# Patient Record
Sex: Male | Born: 1945 | Race: White | Hispanic: No | Marital: Married | Smoking: Never smoker
Health system: Southern US, Academic
[De-identification: ages and names within clinical notes are randomized; demographics above are authoritative.]

## PROBLEM LIST (undated history)

## (undated) DIAGNOSIS — F419 Anxiety disorder, unspecified: Secondary | ICD-10-CM

## (undated) DIAGNOSIS — I6522 Occlusion and stenosis of left carotid artery: Secondary | ICD-10-CM

## (undated) DIAGNOSIS — I739 Peripheral vascular disease, unspecified: Secondary | ICD-10-CM

## (undated) DIAGNOSIS — H9190 Unspecified hearing loss, unspecified ear: Secondary | ICD-10-CM

## (undated) DIAGNOSIS — I10 Essential (primary) hypertension: Secondary | ICD-10-CM

## (undated) DIAGNOSIS — R972 Elevated prostate specific antigen [PSA]: Secondary | ICD-10-CM

## (undated) DIAGNOSIS — H8109 Meniere's disease, unspecified ear: Secondary | ICD-10-CM

## (undated) DIAGNOSIS — F0390 Unspecified dementia without behavioral disturbance: Secondary | ICD-10-CM

## (undated) DIAGNOSIS — K219 Gastro-esophageal reflux disease without esophagitis: Secondary | ICD-10-CM

## (undated) DIAGNOSIS — I2510 Atherosclerotic heart disease of native coronary artery without angina pectoris: Secondary | ICD-10-CM

## (undated) DIAGNOSIS — G40909 Epilepsy, unspecified, not intractable, without status epilepticus: Secondary | ICD-10-CM

## (undated) DIAGNOSIS — E785 Hyperlipidemia, unspecified: Secondary | ICD-10-CM

## (undated) DIAGNOSIS — J449 Chronic obstructive pulmonary disease, unspecified: Secondary | ICD-10-CM

## (undated) HISTORY — DX: Essential (primary) hypertension: I10

## (undated) HISTORY — DX: Gastro-esophageal reflux disease without esophagitis: K21.9

## (undated) HISTORY — DX: Elevated prostate specific antigen (PSA): R97.20

## (undated) HISTORY — PX: HX CORONARY ARTERY BYPASS GRAFT: SHX141

## (undated) HISTORY — DX: Meniere's disease, unspecified ear: H81.09

## (undated) HISTORY — DX: Hyperlipidemia, unspecified: E78.5

## (undated) HISTORY — DX: Occlusion and stenosis of left carotid artery: I65.22

## (undated) HISTORY — DX: Unspecified hearing loss, unspecified ear: H91.90

## (undated) HISTORY — DX: Epilepsy, unspecified, not intractable, without status epilepticus: G40.909

## (undated) HISTORY — DX: Atherosclerotic heart disease of native coronary artery without angina pectoris: I25.10

## (undated) HISTORY — DX: Peripheral vascular disease, unspecified: I73.9

## (undated) HISTORY — DX: Unspecified dementia, unspecified severity, without behavioral disturbance, psychotic disturbance, mood disturbance, and anxiety: F03.90

## (undated) HISTORY — DX: Anxiety disorder, unspecified: F41.9

## (undated) HISTORY — DX: Chronic obstructive pulmonary disease, unspecified: J44.9

---

## 2001-10-27 ENCOUNTER — Other Ambulatory Visit (HOSPITAL_COMMUNITY): Payer: Self-pay

## 2007-05-31 DIAGNOSIS — I6529 Occlusion and stenosis of unspecified carotid artery: Secondary | ICD-10-CM | POA: Insufficient documentation

## 2007-05-31 DIAGNOSIS — E785 Hyperlipidemia, unspecified: Secondary | ICD-10-CM | POA: Insufficient documentation

## 2007-05-31 DIAGNOSIS — I2510 Atherosclerotic heart disease of native coronary artery without angina pectoris: Secondary | ICD-10-CM | POA: Insufficient documentation

## 2015-09-27 DIAGNOSIS — I6522 Occlusion and stenosis of left carotid artery: Secondary | ICD-10-CM | POA: Insufficient documentation

## 2015-09-27 HISTORY — DX: Occlusion and stenosis of left carotid artery: I65.22

## 2019-12-14 IMAGING — MR MRI BRAIN WITHOUT CONTRAST
7 of 9 series · 32 of 48 positions shown · non-contrast
Comparison: None previous.

EXAM:  MRI BRAIN WITHOUT CONTRAST
INDICATION: 74-year-old with worsening memory loss.  History of hypertension, diabetes.
TECHNIQUE: Axial, coronal and sagittal images including diffusion-weighted series, T2* gradient echo sequence, FLAIR sequence, T1 and T2 sequences were obtained.

[Series 9: DWI · axial · 5.0mm · 1.02mm/px · z∈[-17,+103]mm · 10 of 88 slices shown (1 of 3)]
[im 6/88]
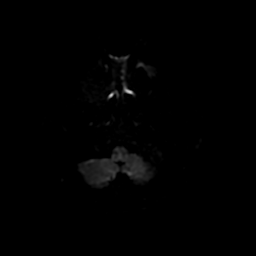
[im 12/88]
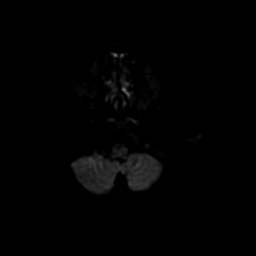
[im 18/88]
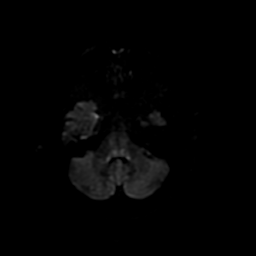
[im 30/88]
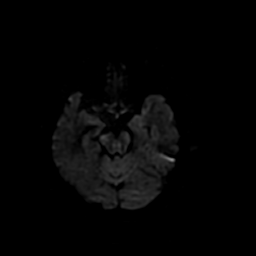
[im 41/88]
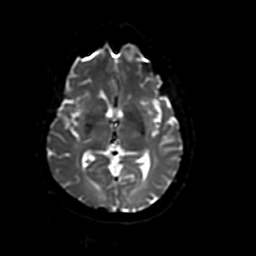
[im 47/88]
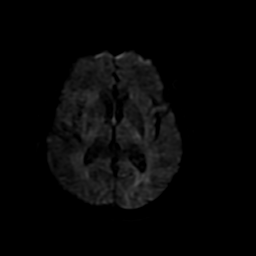
[im 53/88]
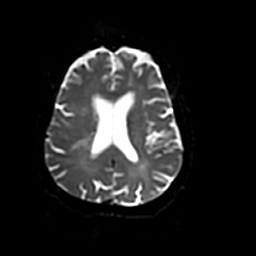
[im 64/88]
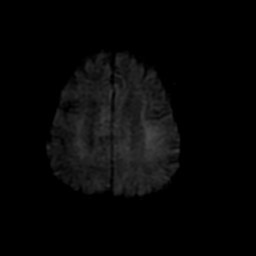
[im 76/88]
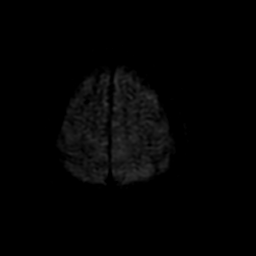
[im 88/88]
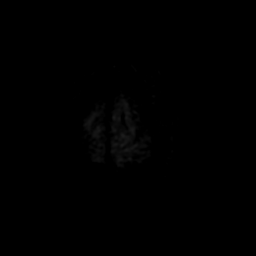

[Series 10: DWI · axial · 5.0mm · 1.02mm/px · z∈[-23,+103]mm · 4 of 22 slices shown (2 of 3)]
[im 1/22]
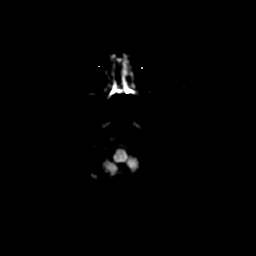
[im 8/22]
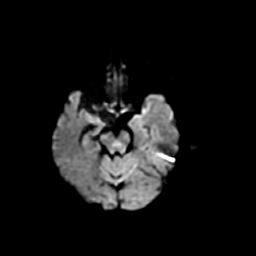
[im 15/22]
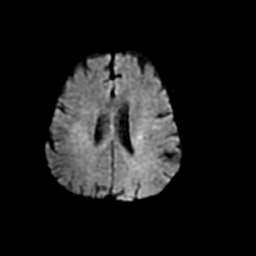
[im 22/22]
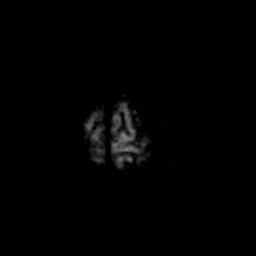

[Series 12: DWI · axial · 5.0mm · 1.02mm/px · z∈[-23,+103]mm · 4 of 22 slices shown (3 of 3)]
[im 1/22]
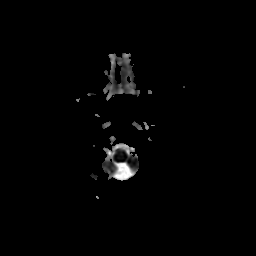
[im 8/22]
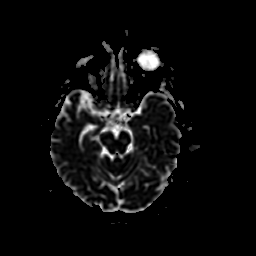
[im 15/22]
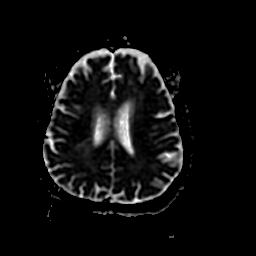
[im 22/22]
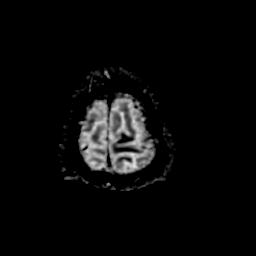

[Series 13: FLAIR · sagittal · 4.0mm · 0.47mm/px · 4 of 26 slices shown (1 of 2)]
[im 1/26]
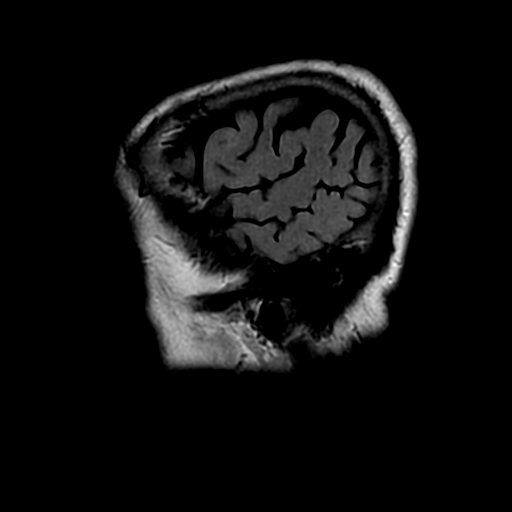
[im 9/26]
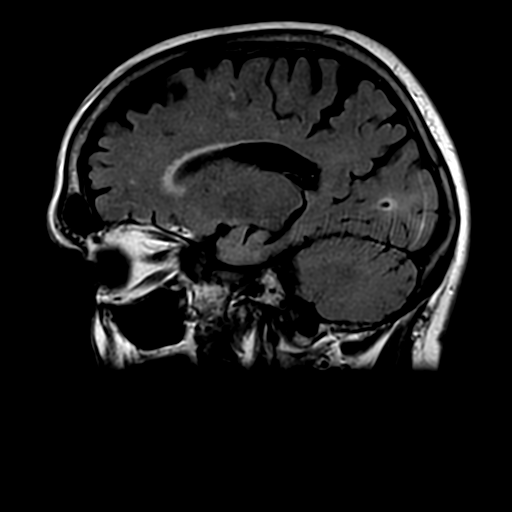
[im 17/26]
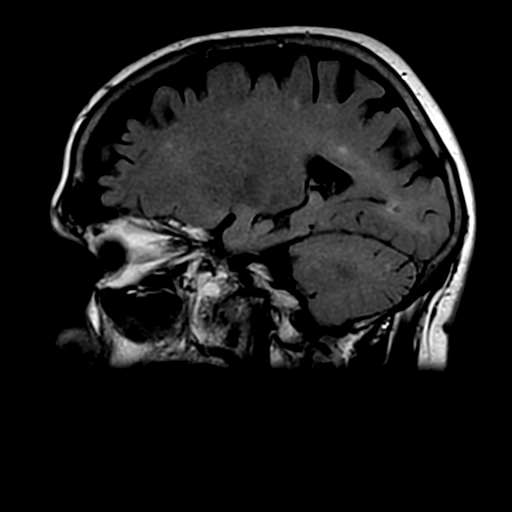
[im 26/26]
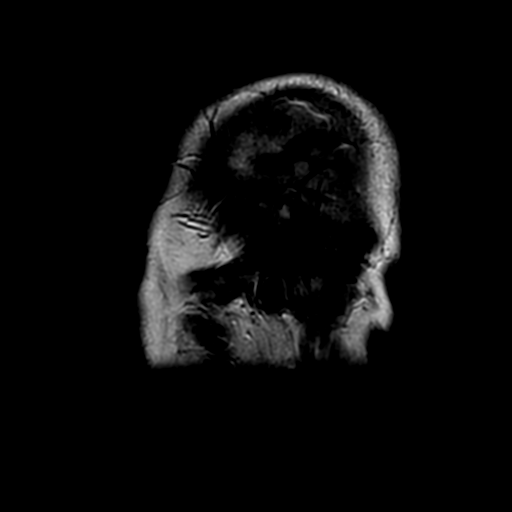

[Series 14: FLAIR · axial · 5.0mm · 0.43mm/px · z∈[-32,+124]mm · 4 of 27 slices shown (2 of 2)]
[im 1/27]
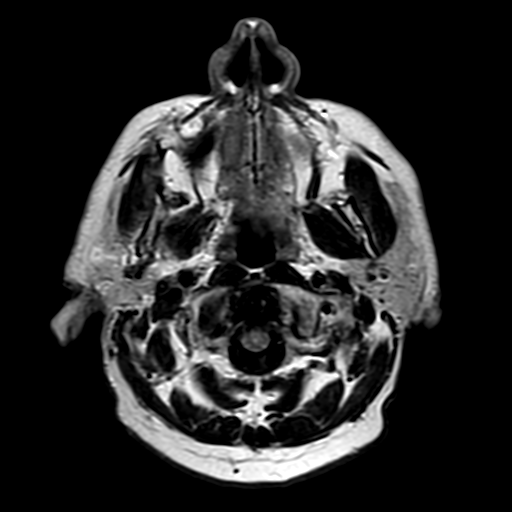
[im 9/27]
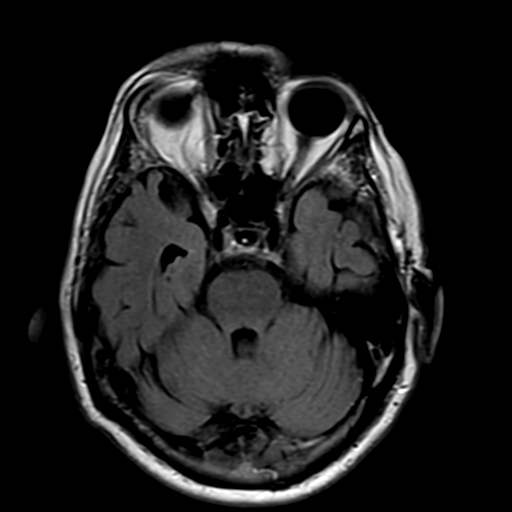
[im 18/27]
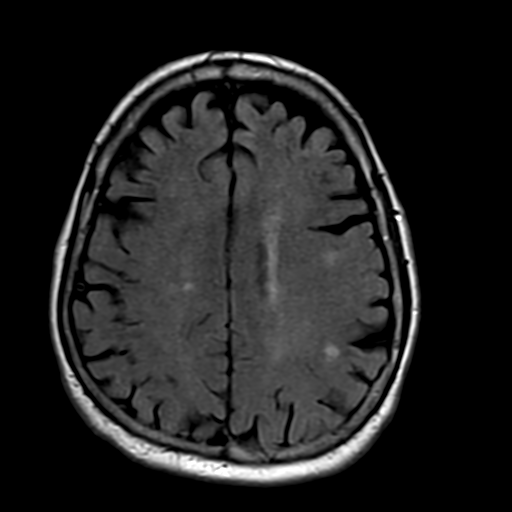
[im 27/27]
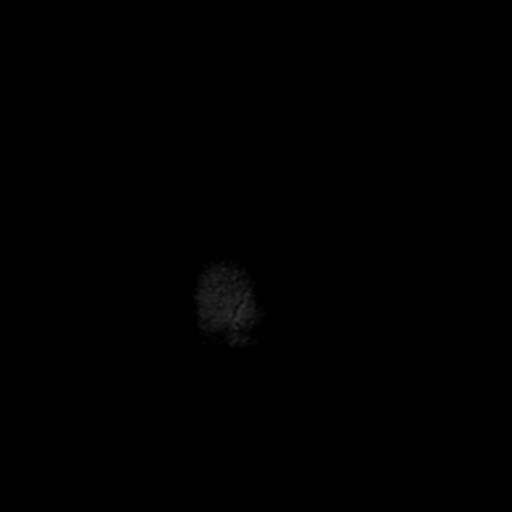

[Series 15: T2 · coronal · 6.0mm · 0.43mm/px · 4 of 24 slices shown (1 of 2)]
[im 1/24]
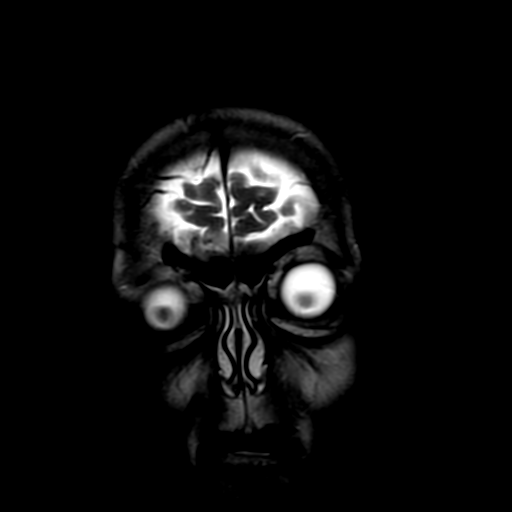
[im 8/24]
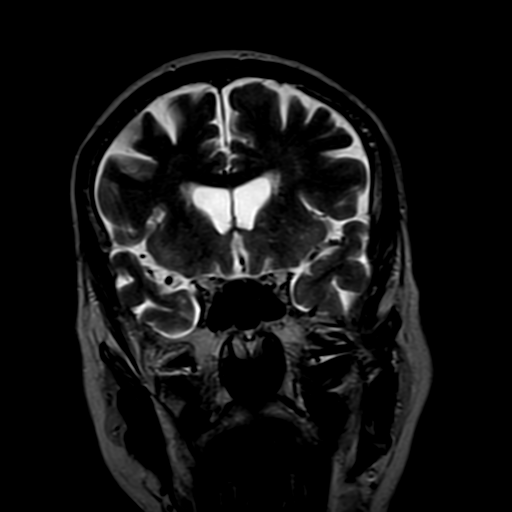
[im 16/24]
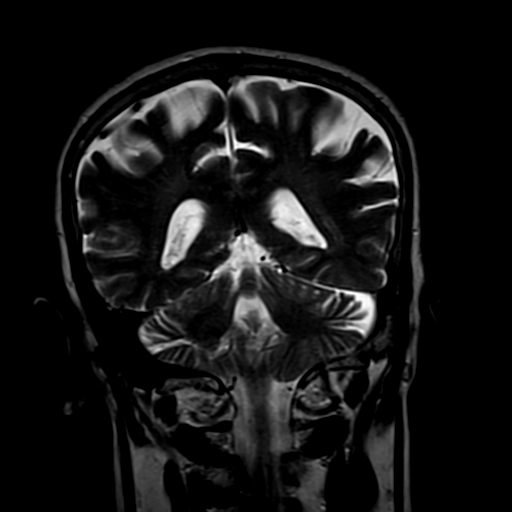
[im 24/24]
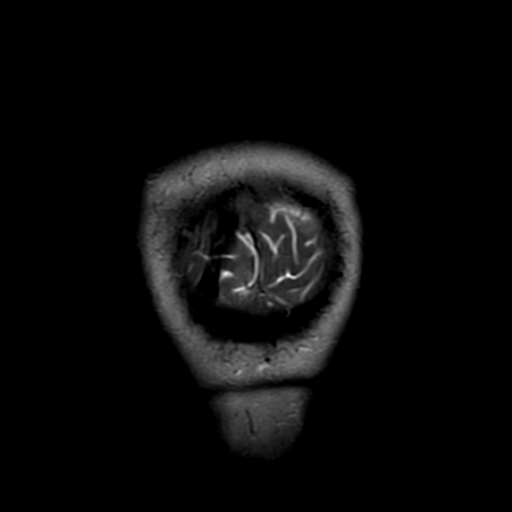

[Series 17: T2 · axial · 5.0mm · 0.43mm/px · z∈[-32,+16]mm · 2 of 27 slices shown (2 of 2)]
[im 1/27]
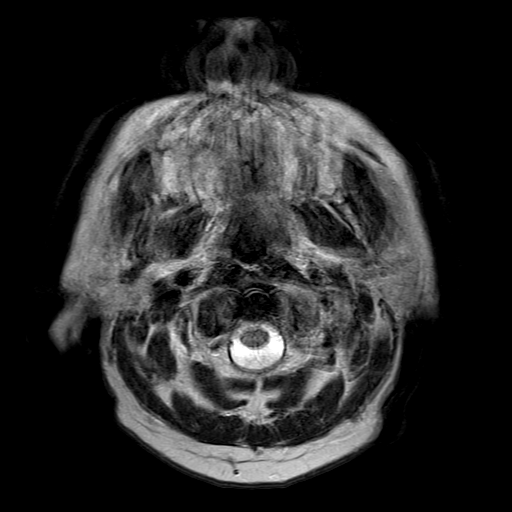
[im 9/27]
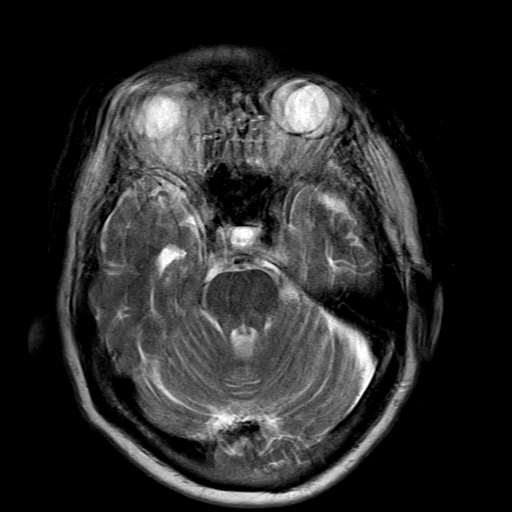

[32 of 48 positions shown; findings below may reference images not displayed]

FINDINGS: Some of the sequences are compromised in quality due to motion artifacts, especially axial T2 sequence.  Overall quality is acceptable. 

No acute ischemic process on diffusion-weighted sequence.  Mild chronic small vessel ischemic change of periventricular and subcortical white matter, slightly more prominent on the left than on the right side.  No ventriculomegaly or midline shift.  No focal abnormalities of the posterior fossa structures.  

Major arteries of circle of Willis and dural venous sinuses are patent.  Sinuses and mastoids do not show acute findings.
IMPRESSION: No acute ischemia, intracranial bleed, ventriculomegaly or space occupying lesions are seen.

Moderate, symmetric, global cerebral cortical atrophy.  Mild to moderate chronic small vessel ischemic change of periventricular and subcortical white matter.

## 2021-07-11 LAB — ENTER/EDIT EXTERNAL COMMON LAB RESULTS
CHOLESTEROL: 151
HDL-CHOLESTEROL: 26
HEMOGLOBIN A1C: 6
LDL (CALCULATED): 86
LDL CHOLESTEROL,DIRECT: 39
TRIGLYCERIDES: 230

## 2021-08-27 ENCOUNTER — Other Ambulatory Visit (INDEPENDENT_AMBULATORY_CARE_PROVIDER_SITE_OTHER): Payer: Self-pay | Admitting: Family

## 2021-08-27 DIAGNOSIS — G40209 Localization-related (focal) (partial) symptomatic epilepsy and epileptic syndromes with complex partial seizures, not intractable, without status epilepticus: Secondary | ICD-10-CM

## 2021-08-28 ENCOUNTER — Ambulatory Visit (INDEPENDENT_AMBULATORY_CARE_PROVIDER_SITE_OTHER): Payer: Medicare Other | Admitting: OTOLARYNGOLOGY

## 2021-08-28 ENCOUNTER — Encounter (INDEPENDENT_AMBULATORY_CARE_PROVIDER_SITE_OTHER): Payer: Self-pay | Admitting: OTOLARYNGOLOGY

## 2021-08-28 ENCOUNTER — Other Ambulatory Visit: Payer: Self-pay

## 2021-08-28 VITALS — Resp 17 | Ht 67.0 in | Wt 180.0 lb

## 2021-08-28 DIAGNOSIS — H9193 Unspecified hearing loss, bilateral: Secondary | ICD-10-CM

## 2021-08-28 DIAGNOSIS — H9202 Otalgia, left ear: Secondary | ICD-10-CM

## 2021-08-28 DIAGNOSIS — H6123 Impacted cerumen, bilateral: Secondary | ICD-10-CM

## 2021-08-28 DIAGNOSIS — H9313 Tinnitus, bilateral: Secondary | ICD-10-CM

## 2021-08-28 DIAGNOSIS — L989 Disorder of the skin and subcutaneous tissue, unspecified: Secondary | ICD-10-CM

## 2021-08-28 MED ORDER — MUPIROCIN 2 % TOPICAL OINTMENT
TOPICAL_OINTMENT | Freq: Three times a day (TID) | CUTANEOUS | 5 refills | Status: DC
Start: 2021-08-28 — End: 2022-04-22

## 2021-08-28 NOTE — H&P (Signed)
[facility], [facility]  [place] [address]  Phone: [phone]  Fax: [phone]      Encounter Date: 08/28/2021    Patient ID: Jason Jacobs  MRN: [id number]    DOB: 1945/09/18  Age: 76 y.o. male        Referring Provider:    Adriana Mccallum, [address]  [place],  [place] [address]    Reason for Visit:   Chief Complaint   Patient presents with   . Hearing Loss     New patient here for left ear hearing loss. PCP said ear was "stopped up". Former heavy Therapist, music.  Also complains of a left scalp skin lesion         History of Present Illness:  Jason Jacobs is a 76 y.o. male who is FU on ears. Pt c/o AS>AD hearing loss, gradually over the years. Endorses loud noise exposure with constant high pitched tinnitus. Denies otalgia, otorrhea, h/o ear infections.  Also complaining of a left scalp lesion that itches and feels rough and irregular has been present for months.  History of moderate sun exposure    Tympanogram: AU Type A      Patient History:  There is no problem list on file for this patient.    Current Outpatient Medications   Medication Sig   . amLODIPine (NORVASC) 10 mg Oral Tablet Take 1 Tablet (10 mg total) by mouth Once a day   . aspirin (ASPIR-81) 81 mg Oral Tablet, Delayed Release (E.C.) Take 1 Tablet (81 mg total) by mouth   . ergocalciferol, vitamin D2, (DRISDOL) 1,250 mcg (50,000 unit) Oral Capsule TAKE 1 CAPSULE BY MOUTH ONE TIME PER WEEK   . FLUoxetine (PROZAC) 40 mg Oral Capsule    . Levetiracetam 750 mg Oral Tablet Sustained Release 24 hr Take 1 Tablet (750 mg total) by mouth Twice daily   . mupirocin (BACTROBAN) 2 % Ointment Apply topically Three times a day   . omeprazole (PRILOSEC) 20 mg Oral Capsule, Delayed Release(E.C.)    . Perindopril Erbumine (ACEON) 4 mg Oral Tablet Take 1 Tablet (4 mg total) by mouth Once a day   . rosuvastatin (CRESTOR) 5 mg Oral Tablet      No Known Allergies  Past Medical History:   Diagnosis Date   . Coronary artery disease    .  Epileptic seizure (CMS [place])    . Esophageal reflux    . Essential hypertension    . Hearing loss       Past Surgical History:   Procedure Laterality Date   . HX CORONARY ARTERY BYPASS GRAFT        Family Medical History:     Problem Relation (Age of Onset)    Cancer Other    Migraines Other    Sleep apnea Other          Social History     Tobacco Use   . Smoking status: Never   . Smokeless tobacco: Never   Substance Use Topics   . Alcohol use: Never   . Drug use: Never       Review of Systems:  Review of Systems   HENT: Positive for congestion.        Physical Exam:  Resp 17   Ht 1.702 m (5\' 7" )   Wt 81.6 kg (180 lb)   BMI 28.19 kg/m       Physical Exam  Constitutional:  Appearance: Normal appearance. He is well-developed, well-groomed and normal weight.   HENT:      Head: Normocephalic and atraumatic.      Right Ear: Hearing, tympanic membrane, ear canal and external ear normal. There is impacted cerumen.      Left Ear: Hearing, tympanic membrane, ear canal and external ear normal. There is impacted cerumen.      Nose: Septal deviation and mucosal edema present.      Right Turbinates: Enlarged.      Left Turbinates: Enlarged.      Mouth/Throat:      Lips: Pink.      Mouth: Mucous membranes are moist.      Pharynx: Oropharynx is clear. Uvula midline.   Eyes:      Extraocular Movements: Extraocular movements intact.   Neck:      Trachea: Phonation normal.   Pulmonary:      Effort: Pulmonary effort is normal.   Musculoskeletal:      Cervical back: Normal range of motion and neck supple.   Lymphadenopathy:      Cervical: No cervical adenopathy.   Skin:     General: Skin is warm.      Comments: Left temple/scalp raised irregular lesion   Neurological:      Mental Status: He is alert and oriented to person, place, and time.      Cranial Nerves: Cranial nerves 2-12 are intact. No facial asymmetry.   Psychiatric:         Attention and Perception: Attention normal.         Mood and Affect: Mood normal.          Speech: Speech normal.         Behavior: Behavior normal. Behavior is cooperative.             Assessment:  ENCOUNTER DIAGNOSES     ICD-10-CM   1. Bilateral impacted cerumen  H61.23   2. 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 Unspecified hearing loss, bilateral  H91.93   4. Otalgia, left  H92.02   5. Lesion of skin of scalp  L98.9       Plan:  Medical records reviewed on 08/28/2021.  AU severe cerumen debrided. Will get audiogram. Discussed masking techniques.   Scalp skin lesion, DX SCC, BCC, AK, SK.  Shave biopsy performed  Postprocedure instructions reviewed, Rx mupirocin ointment  Nasal saline b.i.d.    Orders Placed This Encounter   . [id number] - NASAL ENDOSCOPY DIAGNOSTIC UNILATERAL OR BILATERAL (AMB ONLY)   . [id number] - REMOVAL IMPACTED CERUMEN W/ INSTRUMENT, UNILATERAL (AMB ONLY-PD)   . [id number] - SHAVE EPI/DERMAL LESION >2.0 CM,SCALP,NECK,HANDS,ETC (AMB ONLY)   . AMB PRN REFERRAL EXTERNAL AUDIOLOGIST   . POCT HEARING/VISION/TYMPANOGRAM (AMB ONLY)   . mupirocin (BACTROBAN) 2 % Ointment     Return in about 2 weeks (around 09/11/2021), or if symptoms worsen or fail to improve.     The advanced practice clinician's documentation was reviewed/amended in its entirety with the assessment and plan portion completely performed independently by me during this encounter.    Gillermo Murdoch, DO        Mathis Dad, PA-C  08/28/2021, 10:27

## 2021-08-29 NOTE — Telephone Encounter (Signed)
Duplicate request

## 2021-10-08 ENCOUNTER — Ambulatory Visit (INDEPENDENT_AMBULATORY_CARE_PROVIDER_SITE_OTHER): Payer: Medicare Other | Admitting: OTOLARYNGOLOGY

## 2021-10-08 ENCOUNTER — Other Ambulatory Visit: Payer: Self-pay

## 2021-10-08 ENCOUNTER — Encounter (INDEPENDENT_AMBULATORY_CARE_PROVIDER_SITE_OTHER): Payer: Self-pay | Admitting: OTOLARYNGOLOGY

## 2021-10-08 DIAGNOSIS — H9313 Tinnitus, bilateral: Secondary | ICD-10-CM

## 2021-10-08 DIAGNOSIS — L821 Other seborrheic keratosis: Secondary | ICD-10-CM

## 2021-10-08 DIAGNOSIS — J301 Allergic rhinitis due to pollen: Secondary | ICD-10-CM

## 2021-10-08 DIAGNOSIS — H903 Sensorineural hearing loss, bilateral: Secondary | ICD-10-CM

## 2021-10-08 NOTE — H&P (Signed)
ENT, [facility]  [address]  [place] [address]  Phone: [phone]  Fax: [phone]      Encounter Date: 10/08/2021    Patient ID: Jason Jacobs  MRN: [id number]    DOB: 07/08/1945  Age: 76 y.o. male     Progress Note       Referring Provider:  No ref. provider found    Reason for Visit:   Chief Complaint   Patient presents with   . Follow-up After Testing     Patient here to follow up after audio and scalp biopsy results        History of Present Illness:  Jason Jacobs is a 76 y.o. male who is FU on ears, here to review the audiogram.  Reports that he is had MRIs within the last few years while being seen by neurology for seizure evaluations.  Audiogram: AD mild to sev SNHL, Type A       AS Sev SNHL, Type A    Scalp biopsy results:  Irritated seborrheic keratosis      Patient History:  There is no problem list on file for this patient.    Current Outpatient Medications   Medication Sig   . amLODIPine (NORVASC) 10 mg Oral Tablet Take 1 Tablet (10 mg total) by mouth Once a day   . aspirin (ASPIR-81) 81 mg Oral Tablet, Delayed Release (E.C.) Take 1 Tablet (81 mg total) by mouth   . ergocalciferol, vitamin D2, (DRISDOL) 1,250 mcg (50,000 unit) Oral Capsule TAKE 1 CAPSULE BY MOUTH ONE TIME PER WEEK   . FLUoxetine (PROZAC) 40 mg Oral Capsule    . Levetiracetam 750 mg Oral Tablet Sustained Release 24 hr Take 1 Tablet (750 mg total) by mouth Twice daily   . mupirocin (BACTROBAN) 2 % Ointment Apply topically Three times a day   . omeprazole (PRILOSEC) 20 mg Oral Capsule, Delayed Release(E.C.)    . Perindopril Erbumine (ACEON) 4 mg Oral Tablet Take 1 Tablet (4 mg total) by mouth Once a day   . rosuvastatin (CRESTOR) 5 mg Oral Tablet       No Known Allergies  Past Medical History:   Diagnosis Date   . Coronary artery disease    . Epileptic seizure (CMS [facility])    . Esophageal reflux    . Essential hypertension    . Hearing loss      Past Surgical History:   Procedure Laterality Date   . HX CORONARY ARTERY BYPASS  GRAFT       Family Medical History:     Problem Relation (Age of Onset)    Cancer Other    Migraines Other    Sleep apnea Other          Social History     Tobacco Use   . Smoking status: Never   . Smokeless tobacco: Never   Substance Use Topics   . Alcohol use: Never   . Drug use: Never       Review of Systems:  Review of Systems   HENT: Positive for congestion.        Physical Exam:  There were no vitals taken for this visit.      Physical Exam  Constitutional:       Appearance: Normal appearance. He is well-developed, well-groomed and normal weight.   HENT:      Head: Normocephalic and atraumatic.      Right Ear: Hearing, tympanic membrane, ear canal and external ear normal. There is  no impacted cerumen.      Left Ear: Hearing, tympanic membrane, ear canal and external ear normal. There is no impacted cerumen.      Nose: Septal deviation and mucosal edema present.      Right Turbinates: Enlarged.      Left Turbinates: Enlarged.      Mouth/Throat:      Lips: Pink.      Mouth: Mucous membranes are moist.      Pharynx: Oropharynx is clear. Uvula midline.   Eyes:      Extraocular Movements: Extraocular movements intact.   Neck:      Trachea: Phonation normal.   Pulmonary:      Effort: Pulmonary effort is normal.   Musculoskeletal:      Cervical back: Normal range of motion and neck supple.   Lymphadenopathy:      Cervical: No cervical adenopathy.   Skin:     General: Skin is warm.      Comments: Biopsy site well healed   Neurological:      Mental Status: He is alert and oriented to person, place, and time.      Cranial Nerves: Cranial nerves 2-12 are intact. No facial asymmetry.   Psychiatric:         Attention and Perception: Attention normal.         Mood and Affect: Mood normal.         Speech: Speech normal.         Behavior: Behavior normal. Behavior is cooperative.         Assessment:  ENCOUNTER DIAGNOSES     ICD-10-CM   1. Tinnitus of both ears  H93.13   2. ASNHL (asymmetrical sensorineural hearing loss)   H90.3   3. 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 Seborrheic keratosis of scalp  L82.1       Plan:  Medical records reviewed on 10/08/2021.  Reviewed audiogram.  Patient with asymmetrical sensorineural hearing loss.  Pt's wife will bring Brain MRI results/read ( h/o seizure).   Recommend hearing aids  Scalp biopsy results were reviewed, benign SK      Return in about 3 months (around 01/08/2022), or if symptoms worsen or fail to improve.     The advanced practice clinician's documentation was reviewed/amended in its entirety with the assessment and plan portion completely performed independently by me during this encounter.    Lonia Farber, DO        Marcelline Deist, PA-C  10/08/2021, 11:27

## 2021-10-09 ENCOUNTER — Encounter (INDEPENDENT_AMBULATORY_CARE_PROVIDER_SITE_OTHER): Payer: Self-pay | Admitting: OTOLARYNGOLOGY

## 2021-10-15 ENCOUNTER — Encounter (INDEPENDENT_AMBULATORY_CARE_PROVIDER_SITE_OTHER): Payer: Self-pay | Admitting: OTOLARYNGOLOGY

## 2021-10-15 NOTE — Procedures (Signed)
ENT, [facility]  [address]  [place] [address]    Procedure Note    Name: Jason Jacobs MRN:  [id number]   Date: 08/28/2021 Age: 75 y.o.       [id number] - REMOVAL IMPACTED CERUMEN W/ INSTRUMENT, UNILATERAL (AMB ONLY-PD)  Performed by: Lonia Farber, DO  Authorized by: Lonia Farber, DO         Procedure: Cerumen cleaning  Pre-op Dx: Cerumen impaction      Bilateral EAC(s) examined under binocular microscopy.  Cerumen and/or debris was cleaned from the canal(s) using curettes, suction, and alligator forceps.  Patient tolerated procedure well.  ENT was present for the entire procedure.    Lonia Farber, DO

## 2021-10-15 NOTE — Procedures (Signed)
ENT, [facility]  [address]  [place] [address]    Procedure Note    Name: Jason Jacobs MRN:  [id number]   Date: 08/28/2021 Age: 76 y.o.       31231 - NASAL ENDOSCOPY DIAGNOSTIC UNILATERAL OR BILATERAL (AMB ONLY)  Performed by: Lonia Farber, DO  Authorized by: Lonia Farber, DO         Indications for procedure: Otalgia    Anesthesia: Oxymetazoline nasal spray    Description: Nasal endoscopy with rigid scope was performed with examination of the  septum, inferior, middle, and superior meatus, turbinates, sphenoethmoidal recess, and nasopharynx.     There were no polyps, pus, or granulation tissue noted.  ET orifices and nasopharynx were normal.     Findings: Allergic rhinitis    The patient tolerated the procedure well.          Lonia Farber, DO

## 2021-10-15 NOTE — Procedures (Signed)
ENT, [facility]  [address]  [place] [address]    Procedure Note    Name: Jason Jacobs MRN:  [id number]   Date: 08/28/2021 Age: 76 y.o.       11308 - SHAVE EPI/DERMAL LESION >2.0 CM,SCALP,NECK,HANDS,ETC (AMB ONLY)  Performed by: Lonia Farber, DO  Authorized by: Lonia Farber, DO         Procedure:  Shave excision skin lesion  Lesion location and size:  2.2 cm raised irregular pigmented left scalp lesion    Description of procedure:   The lesion was prepped with alcohol.  1% lidocaine with 100,000 epinephrine was injected into the site, 1 cc total.  Using a persona blade the lesion was excised.  Specimen was sent for pathologic identification.  Hemostasis was achieved with aluminum chloride.  Bactroban was applied to the wound.   Lonia Farber, DO

## 2021-11-05 ENCOUNTER — Telehealth (INDEPENDENT_AMBULATORY_CARE_PROVIDER_SITE_OTHER): Payer: Self-pay | Admitting: Internal Medicine

## 2021-11-05 NOTE — Telephone Encounter (Signed)
Per vm:  Requesting refill    Perindopril Erbumine 4mg  tablet 1 daily  Keppra XR 750mg  oral tablet extended release 1 tablet 2 times daily  [facility]

## 2021-11-06 MED ORDER — LEVETIRACETAM ER 750 MG TABLET,EXTENDED RELEASE 24 HR
1.0000 | ORAL_TABLET | Freq: Two times a day (BID) | ORAL | 3 refills | Status: DC
Start: 2021-11-06 — End: 2022-01-30

## 2021-11-06 MED ORDER — PERINDOPRIL ERBUMINE 4 MG TABLET
4.0000 mg | ORAL_TABLET | Freq: Every day | ORAL | 3 refills | Status: DC
Start: 2021-11-06 — End: 2022-11-13

## 2021-11-07 ENCOUNTER — Encounter (INDEPENDENT_AMBULATORY_CARE_PROVIDER_SITE_OTHER): Payer: Self-pay | Admitting: Internal Medicine

## 2022-01-29 ENCOUNTER — Encounter (INDEPENDENT_AMBULATORY_CARE_PROVIDER_SITE_OTHER): Payer: Self-pay | Admitting: NURSE PRACTITIONER

## 2022-01-30 ENCOUNTER — Encounter (INDEPENDENT_AMBULATORY_CARE_PROVIDER_SITE_OTHER): Payer: Self-pay | Admitting: NURSE PRACTITIONER

## 2022-01-30 ENCOUNTER — Other Ambulatory Visit: Payer: Self-pay

## 2022-01-30 ENCOUNTER — Ambulatory Visit (INDEPENDENT_AMBULATORY_CARE_PROVIDER_SITE_OTHER): Payer: Medicare Other | Admitting: NURSE PRACTITIONER

## 2022-01-30 VITALS — BP 129/64 | HR 64 | Ht 67.0 in | Wt 185.6 lb

## 2022-01-30 DIAGNOSIS — E785 Hyperlipidemia, unspecified: Secondary | ICD-10-CM

## 2022-01-30 DIAGNOSIS — F0390 Unspecified dementia without behavioral disturbance: Secondary | ICD-10-CM

## 2022-01-30 DIAGNOSIS — F419 Anxiety disorder, unspecified: Secondary | ICD-10-CM

## 2022-01-30 DIAGNOSIS — I2510 Atherosclerotic heart disease of native coronary artery without angina pectoris: Secondary | ICD-10-CM

## 2022-01-30 DIAGNOSIS — K219 Gastro-esophageal reflux disease without esophagitis: Secondary | ICD-10-CM

## 2022-01-30 DIAGNOSIS — I10 Essential (primary) hypertension: Secondary | ICD-10-CM

## 2022-01-30 DIAGNOSIS — G40909 Epilepsy, unspecified, not intractable, without status epilepticus: Secondary | ICD-10-CM

## 2022-01-30 DIAGNOSIS — E538 Deficiency of other specified B group vitamins: Secondary | ICD-10-CM

## 2022-01-30 MED ORDER — LEVETIRACETAM ER 750 MG TABLET,EXTENDED RELEASE 24 HR
1.0000 | ORAL_TABLET | Freq: Two times a day (BID) | ORAL | 1 refills | Status: DC
Start: 2022-01-30 — End: 2022-06-20

## 2022-01-30 NOTE — Assessment & Plan Note (Signed)
Condition stable will continue current therapy with Prilosec.

## 2022-01-30 NOTE — Assessment & Plan Note (Addendum)
Condition stable will continue current therapy with Norvasc  Routine monitoring labs ordered, will evaluate for any significant abnormalities and discuss changes with patient to current management as necessary based on results.

## 2022-01-30 NOTE — Assessment & Plan Note (Signed)
Condition stable will continue current therapy with Prozac.

## 2022-01-30 NOTE — Assessment & Plan Note (Signed)
Discussed with patient and wife, states that he has been feeling a little more forgetful recently especially as it relates to short term memory.Patient was previously following with Dr. Marta Antu but has not seen neurology since he retired. Discussed referral to new neurologist, but pt declined at this time, instructed if he decided he would like referral to let me know.

## 2022-01-30 NOTE — Assessment & Plan Note (Signed)
Condition stable will continue current therapy with Crestor.

## 2022-01-30 NOTE — Assessment & Plan Note (Signed)
Patient is following with cardiology every 6 months in [place], has next appt next week.

## 2022-01-30 NOTE — Assessment & Plan Note (Addendum)
Condition stable will continue current therapy with Keppra.   Patient states has not had seizure in years.

## 2022-01-30 NOTE — Progress Notes (Signed)
[facility], [facility]  [address]  [place] [address]       Name: Jason Jacobs MRN:  [id number]   Date: 01/30/2022 Age: 76 y.o.       Chief Complaint:    Chief Complaint   Patient presents with    Follow Up 6 Months     Former Dr Jason Jacobs patient        HPI:  Jason Jacobs is a 76 y.o. male who is here today to reestablish care.   He is complaining today of an increase in some short term memory loss.   He denies any chest pain/pressure, shortness of breath, headaches or visual disturbance.   He denies any neurologic or focal deficits. He denies any GI or GU complaints.             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[facility])     Coronary artery disease     Dementia (CMS [facility])     Elevated PSA     Epileptic seizure (CMS [facility])     Esophageal reflux     Essential hypertension     Hearing loss     Hyperlipidemia     Meniere's disease     PAD (peripheral artery disease) (CMS HCC)          Past Surgical History:   Procedure Laterality Date    HX CORONARY ARTERY BYPASS GRAFT        Current Outpatient Medications   Medication Sig    amLODIPine (NORVASC) 10 mg Oral Tablet Take 1 Tablet (10 mg total) by mouth Once a day    aspirin (ECOTRIN) 81 mg Oral Tablet, Delayed Release (E.C.) Take 1 Tablet (81 mg total) by mouth    ergocalciferol, vitamin D2, (DRISDOL) 1,250 mcg (50,000 unit) Oral Capsule TAKE 1 CAPSULE BY MOUTH ONE TIME PER WEEK    FLUoxetine (PROZAC) 40 mg Oral Capsule     Levetiracetam 750 mg Oral Tablet Sustained Release 24 hr Take 1 Tablet (750 mg total) by mouth Twice daily    mupirocin (BACTROBAN) 2 % Ointment Apply topically Three times a day    omeprazole (PRILOSEC) 20 mg Oral Capsule, Delayed Release(E.C.)     Perindopril Erbumine (ACEON) 4 mg Oral Tablet Take 1 Tablet (4 mg total) by mouth Once a day    rosuvastatin (CRESTOR) 5 mg Oral Tablet      No Known Allergies    Family History:  Family Medical History:        Problem Relation (Age of Onset)    Cancer Other    Elevated Lipids Mother    Emphysema Father    Heart Attack Father    Migraines Other    Sleep apnea Other              Social History:   Social History     Tobacco Use   Smoking Status Never   Smokeless Tobacco Never     Social History     Substance and Sexual Activity   Alcohol Use Never     Social History     Occupational History    Not on file       Review of Systems:  Review of systems as discussed in HPI    Problem List:  Patient Active Problem List   Diagnosis    Arteriosclerotic cardiovascular disease (ASCVD)  Carotid artery stenosis    Carotid stenosis, left    Hyperlipidemia    Epileptic seizure (CMS [facility])    Essential hypertension    Esophageal reflux    Anxiety    Dementia (CMS [facility])       Physical Examination:  BP 129/64 (Site: Left, Patient Position: Sitting)   Pulse 64   Ht 1.702 m (5\' 7" )   Wt 84.2 kg (185 lb 9.6 oz)   SpO2 95%   BMI 29.07 kg/m       Physical Exam  Vitals and nursing note reviewed.   Constitutional:       General: He is not in acute distress.     Appearance: Normal appearance. He is normal weight.   HENT:      Head: Normocephalic and atraumatic.   Eyes:      Extraocular Movements: Extraocular movements intact.   Neck:      Vascular: No carotid bruit.   Cardiovascular:      Rate and Rhythm: Normal rate and regular rhythm.      Heart sounds: S1 normal and S2 normal. No murmur heard.  Pulmonary:      Effort: Pulmonary effort is normal.      Breath sounds: Normal breath sounds.   Abdominal:      General: Bowel sounds are normal.      Palpations: Abdomen is soft.   Musculoskeletal:         General: Normal range of motion.      Cervical back: Normal range of motion.   Skin:     General: Skin is warm and dry.      Capillary Refill: Capillary refill takes less than 2 seconds.   Neurological:      General: No focal deficit present.      Mental Status: He is alert and oriented to person, place, and time.   Psychiatric:         Mood  and Affect: Mood normal.      Data Reviewed:  Documentation Only on 11/07/2021   Component Date Value Ref Range Status    HEMOGLOBIN A1C 07/11/2021 6.0   Final    CHOLESTEROL 07/11/2021 151   Final    HDL-CHOLESTEROL 07/11/2021 26   Final    LDL (CALCULATED) 07/11/2021 86   Final    TRIGLYCERIDES 07/11/2021 230   Final    LDL CHOLESTEROL,DIRECT 07/11/2021 39   Final        Health Maintenance:  Health Maintenance   Topic Date Due    Hepatitis C screening  Never done    Adult Tdap-Td (1 - Tdap) Never done    Pneumococcal Vaccination, Age 3+ (2 - PPSV23 or PCV20) 09/20/2016    Shingles Vaccine (3 of 3) 11/26/2016    Covid-19 Vaccine (4 - Pfizer series) 04/25/2020    Annual Wellness Visit  Never done    Influenza Vaccine (1) 01/24/2022    Depression Screening  01/31/2023    Meningococcal Vaccine  Aged Out        Assessment & Plan   Problem List Items Addressed This Visit          Cardiovascular System    Arteriosclerotic cardiovascular disease (ASCVD) (Chronic)     Patient is following with cardiology every 6 months in [place], has next appt next week.          Hyperlipidemia (Chronic)     Condition stable will continue current therapy with Crestor.  Essential hypertension (Chronic)     Condition stable will continue current therapy with Norvasc.   Routine monitoring labs ordered, will evaluate for any significant abnormalities and discuss changes with patient to current management as necessary based on results.              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   Patient states has not had seizure in years.           Dementia (CMS HCC) (Chronic)     Discussed with patient and wife, states that he has been feeling a little more forgetful recently especially as it relates to short term  memory.Patient was previously following with Dr. Marta Jacobs but has not seen neurology since he retired. Discussed referral to new neurologist, but pt declined at this time, instructed if he decided he would like referral to let me know.             Digestive    Esophageal reflux (Chronic)     Condition stable will continue current therapy with Prilosec.               Psychiatric    Anxiety (Chronic)     Condition stable will continue current therapy with Prozac.             Other Visit Diagnoses       Vitamin B12 deficiency        Relevant Orders    VITAMIN B12             Depression screening is negative. PHQ 2 Total: 0      Chronic disease management follow up. Patient is taking and tolerating all medications well without complaint of negative side effects.   Past medical history/medications/labs/ and current plan of care summarized and discussed in detail with patient during visit.   Return to clinic with new or worsening symptoms.       Follow up:  Return in about 6 months (around 07/31/2022).    This note was partially created using voice recognition software and is inherently subject to errors including those of syntax and "sound alike " substitutions which may escape proof reading.  In such instances, original meaning may be extrapolated by contextual derivation.    Macario Golds, FNP-C  01/30/2022, 09:07

## 2022-01-31 ENCOUNTER — Encounter (INDEPENDENT_AMBULATORY_CARE_PROVIDER_SITE_OTHER): Payer: Self-pay | Admitting: NURSE PRACTITIONER

## 2022-01-31 DIAGNOSIS — E538 Deficiency of other specified B group vitamins: Secondary | ICD-10-CM

## 2022-01-31 DIAGNOSIS — I10 Essential (primary) hypertension: Secondary | ICD-10-CM

## 2022-03-24 ENCOUNTER — Other Ambulatory Visit: Payer: Self-pay

## 2022-04-22 ENCOUNTER — Ambulatory Visit (INDEPENDENT_AMBULATORY_CARE_PROVIDER_SITE_OTHER): Payer: Medicare Other | Admitting: OTOLARYNGOLOGY

## 2022-04-22 ENCOUNTER — Other Ambulatory Visit: Payer: Self-pay

## 2022-04-22 ENCOUNTER — Encounter (INDEPENDENT_AMBULATORY_CARE_PROVIDER_SITE_OTHER): Payer: Self-pay | Admitting: OTOLARYNGOLOGY

## 2022-04-22 VITALS — Ht 67.0 in | Wt 180.0 lb

## 2022-04-22 DIAGNOSIS — H903 Sensorineural hearing loss, bilateral: Secondary | ICD-10-CM

## 2022-04-22 DIAGNOSIS — L989 Disorder of the skin and subcutaneous tissue, unspecified: Secondary | ICD-10-CM

## 2022-04-22 DIAGNOSIS — H9313 Tinnitus, bilateral: Secondary | ICD-10-CM

## 2022-04-22 DIAGNOSIS — J301 Allergic rhinitis due to pollen: Secondary | ICD-10-CM

## 2022-04-22 DIAGNOSIS — H6123 Impacted cerumen, bilateral: Secondary | ICD-10-CM

## 2022-04-22 DIAGNOSIS — L821 Other seborrheic keratosis: Secondary | ICD-10-CM

## 2022-04-22 MED ORDER — MUPIROCIN 2 % TOPICAL OINTMENT
TOPICAL_OINTMENT | Freq: Three times a day (TID) | CUTANEOUS | 5 refills | Status: DC
Start: 2022-04-22 — End: 2022-07-03

## 2022-04-22 NOTE — H&P (Signed)
ENT, [facility]  [address]  [place] [address]  Phone: [phone]  Fax: [phone]      Encounter Date: 04/22/2022    Patient ID: Jason Jacobs  MRN: [id number]    DOB: 07/15/1945  Age: 76 y.o. male     Progress Note       Referring Provider:  No ref. provider found    Reason for Visit:   Chief Complaint   Patient presents with    Follow Up 6 Months     Patient here for follow up on ears.         History of Present Illness:  Jason Jacobs is a 76 y.o. male who is FU on ears. C/o aural clogging. Denies otalgia, otorrhea, vertigo. Wears HAIDs. No other complaints.  Complains skin lesion left neck.  History of precancerous lesions in the past    Tympanogram: AU Type A    Patient History:  Patient Active Problem List   Diagnosis    Arteriosclerotic cardiovascular disease (ASCVD)    Carotid artery stenosis    Carotid stenosis, left    Hyperlipidemia    Epileptic seizure (CMS HCC)    Essential hypertension    Esophageal reflux    Anxiety    Dementia (CMS HCC)     Current Outpatient Medications   Medication Sig    amLODIPine (NORVASC) 10 mg Oral Tablet Take 1 Tablet (10 mg total) by mouth Once a day    aspirin (ECOTRIN) 81 mg Oral Tablet, Delayed Release (E.C.) Take 1 Tablet (81 mg total) by mouth    ergocalciferol, vitamin D2, (DRISDOL) 1,250 mcg (50,000 unit) Oral Capsule TAKE 1 CAPSULE BY MOUTH ONE TIME PER WEEK    FLUoxetine (PROZAC) 40 mg Oral Capsule     Levetiracetam 750 mg Oral Tablet Sustained Release 24 hr Take 1 Tablet (750 mg total) by mouth Twice daily    mupirocin (BACTROBAN) 2 % Ointment Apply topically Three times a day    omeprazole (PRILOSEC) 20 mg Oral Capsule, Delayed Release(E.C.)     Perindopril Erbumine (ACEON) 4 mg Oral Tablet Take 1 Tablet (4 mg total) by mouth Once a day    rosuvastatin (CRESTOR) 5 mg Oral Tablet       No Known Allergies  Past Medical History:   Diagnosis Date    Anxiety     Chronic obstructive airway disease (CMS HCC)     Coronary artery disease     Dementia (CMS  HCC)     Elevated PSA     Epileptic seizure (CMS HCC)     Esophageal reflux     Essential hypertension     Hearing loss     Hyperlipidemia     Meniere's disease     PAD (peripheral artery disease) (CMS HCC)      Past Surgical History:   Procedure Laterality Date    HX CORONARY ARTERY BYPASS GRAFT       Family Medical History:       Problem Relation (Age of Onset)    Cancer Other    Elevated Lipids Mother    Emphysema Father    Heart Attack Father    Migraines Other    Sleep apnea Other            Social History     Tobacco Use    Smoking status: Never    Smokeless tobacco: Never   Substance Use Topics    Alcohol use: Never    Drug use: Never  Review of Systems:  Review of Systems    Physical Exam:  Ht 1.702 m (5\' 7" )   Wt 81.6 kg (180 lb)   BMI 28.19 kg/m       Physical Exam  Constitutional:       Appearance: Normal appearance. He is well-developed, well-groomed and normal weight.   HENT:      Head: Normocephalic and atraumatic.      Right Ear: Hearing, tympanic membrane, ear canal and external ear normal. There is impacted cerumen.      Left Ear: Hearing, tympanic membrane, ear canal and external ear normal. There is impacted cerumen.      Nose: Septal deviation and mucosal edema present.      Right Turbinates: Enlarged.      Left Turbinates: Enlarged.      Mouth/Throat:      Lips: Pink.      Mouth: Mucous membranes are moist.      Pharynx: Oropharynx is clear. Uvula midline.   Eyes:      Extraocular Movements: Extraocular movements intact.   Neck:      Trachea: Phonation normal.      Comments: 1.2 cm raised hyperkeratotic skin lesion left neck  Pulmonary:      Effort: Pulmonary effort is normal.   Musculoskeletal:      Cervical back: Normal range of motion and neck supple.   Lymphadenopathy:      Cervical: No cervical adenopathy.   Skin:     General: Skin is warm.   Neurological:      Mental Status: He is alert and oriented to person, place, and time.      Cranial Nerves: Cranial nerves 2-12 are intact. No  facial asymmetry.   Psychiatric:         Attention and Perception: Attention normal.         Mood and Affect: Mood normal.         Speech: Speech normal.         Behavior: Behavior normal. Behavior is cooperative.         Assessment:  ENCOUNTER DIAGNOSES     ICD-10-CM   1. Tinnitus of both ears  H93.13   2. ASNHL (asymmetrical sensorineural hearing loss)  H90.3   3. 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 Seborrheic keratosis of scalp  L82.1   5. Bilateral impacted cerumen  H61.23   6. Skin lesion of neck  L98.9       Plan:  Medical records reviewed on 04/22/2022.  Left neck skin lesion. Ddx scc, BCC, ak.  Shave excision biopsy performed.  PostprocedureInstructions reviewed   Rx mupirocin ointment  AU cerumen debrided.   Follow-up in 2 weeks to review biopsy results  Orders Placed This Encounter    [phone] - REMOVAL IMPACTED CERUMEN W/ INSTRUMENT, UNILATERAL (AMB ONLY-PD)    11307 - SHAVE EPI/DERMAL LESION 1.1-2.0 CM,SCALP,NECK,HANDS,ETC (AMB ONLY)    POCT HEARING/VISION/TYMPANOGRAM (AMB ONLY)    mupirocin (BACTROBAN) 2 % Ointment     The advanced practice clinician's documentation was reviewed/amended in its entirety with the assessment and plan portion completely performed independently by me during this encounter.    [id number], DO      Lonia Farber, PA-C  04/22/2022, 10:52

## 2022-04-23 NOTE — Procedures (Signed)
[facility], [facility]  [address]  [place] [address]    Procedure Note    Name: Jason Jacobs MRN:  [id number]   Date: 04/22/2022 Age: 76 y.o.  DOB:   [birth year]       11307 - SHAVE EPI/DERMAL LESION 1.1-2.0 CM,SCALP,NECK,HANDS,ETC (AMB ONLY)    Performed by: Lonia Farber, DO  Authorized by: Lonia Farber, DO    Time Out:     Immediately before the procedure, a time out was called:  Yes    Patient verified:  Yes    Procedure Verified:  Yes    Site Verified:  Yes    Procedure:  Shave excision skin lesion  Lesion location and size:1.2 cm raised hyperkeratotic skin lesion left neck     Description of procedure:   The lesion was prepped with alcohol.  1% lidocaine with 100,000 epinephrine was injected into the site, 1 cc total.  Using a persona blade the lesion was excised.  Specimen was sent for pathologic identification.  Hemostasis was achieved with aluminum chloride.  Bactroban was applied to the wound.   Lonia Farber, DO

## 2022-04-23 NOTE — Procedures (Signed)
[facility], [facility]  [address]  [place] [address]    Procedure Note    Name: Jason Jacobs MRN:  [id number]   Date: 04/22/2022 Age: 76 y.o.  DOB:   Jul 18, 1945       08811 - REMOVAL IMPACTED CERUMEN W/ INSTRUMENT, UNILATERAL (AMB ONLY-PD)    Performed by: Lonia Farber, DO  Authorized by: Lonia Farber, DO    Time Out:     Immediately before the procedure, a time out was called:  Yes    Patient verified:  Yes    Procedure Verified:  Yes    Site Verified:  Yes    Procedure: Cerumen cleaning  Pre-op Dx: Cerumen impaction      Bilateral EAC(s) examined under binocular microscopy.  Cerumen and/or debris was cleaned from the canal(s) using curettes, suction, and alligator forceps.  Patient tolerated procedure well.  ENT was present for the entire procedure.   Lonia Farber, DO

## 2022-05-09 ENCOUNTER — Ambulatory Visit (INDEPENDENT_AMBULATORY_CARE_PROVIDER_SITE_OTHER): Payer: Medicare Other | Admitting: OTOLARYNGOLOGY

## 2022-05-09 ENCOUNTER — Encounter (INDEPENDENT_AMBULATORY_CARE_PROVIDER_SITE_OTHER): Payer: Self-pay | Admitting: OTOLARYNGOLOGY

## 2022-05-09 ENCOUNTER — Other Ambulatory Visit: Payer: Self-pay

## 2022-05-09 VITALS — Wt 180.0 lb

## 2022-05-09 DIAGNOSIS — L989 Disorder of the skin and subcutaneous tissue, unspecified: Secondary | ICD-10-CM

## 2022-05-09 DIAGNOSIS — K219 Gastro-esophageal reflux disease without esophagitis: Secondary | ICD-10-CM

## 2022-05-09 DIAGNOSIS — H903 Sensorineural hearing loss, bilateral: Secondary | ICD-10-CM

## 2022-05-09 DIAGNOSIS — H9313 Tinnitus, bilateral: Secondary | ICD-10-CM

## 2022-05-09 DIAGNOSIS — J301 Allergic rhinitis due to pollen: Secondary | ICD-10-CM

## 2022-05-09 NOTE — H&P (Signed)
[facility], [place]  [place] [address]  Phone: [phone]  Fax: [phone]      Encounter Date: 05/09/2022    Patient ID: Jason Jacobs  MRN: [id number]    DOB: [birth year]  Age: 76 y.o. male     Progress Note       Referring Provider:  No ref. provider found    Reason for Visit:   Chief Complaint   Patient presents with    Skin  Lesion     Patient here for 2 week follow up on skin lesion bx        History of Present Illness:  Jason Jacobs is a 76 y.o. male who is FU on left neck skin lesion here to review biopsy results.    No ear complaints today. Denies otalgia, otorrhea, vertigo. Wears HAIDs.       Patient History:  Patient Active Problem List   Diagnosis    Arteriosclerotic cardiovascular disease (ASCVD)    Carotid artery stenosis    Carotid stenosis, left    Hyperlipidemia    Epileptic seizure (CMS HCC)    Essential hypertension    Esophageal reflux    Anxiety    Dementia (CMS HCC)     Current Outpatient Medications   Medication Sig    amLODIPine (NORVASC) 10 mg Oral Tablet Take 1 Tablet (10 mg total) by mouth Once a day    aspirin (ECOTRIN) 81 mg Oral Tablet, Delayed Release (E.C.) Take 1 Tablet (81 mg total) by mouth    ergocalciferol, vitamin D2, (DRISDOL) 1,250 mcg (50,000 unit) Oral Capsule TAKE 1 CAPSULE BY MOUTH ONE TIME PER WEEK    FLUoxetine (PROZAC) 40 mg Oral Capsule     Levetiracetam 750 mg Oral Tablet Sustained Release 24 hr Take 1 Tablet (750 mg total) by mouth Twice daily    mupirocin (BACTROBAN) 2 % Ointment Apply topically Three times a day    omeprazole (PRILOSEC) 20 mg Oral Capsule, Delayed Release(E.C.)     Perindopril Erbumine (ACEON) 4 mg Oral Tablet Take 1 Tablet (4 mg total) by mouth Once a day    rosuvastatin (CRESTOR) 5 mg Oral Tablet       No Known Allergies  Past Medical History:   Diagnosis Date    Anxiety     Chronic obstructive airway disease (CMS HCC)     Coronary artery disease     Dementia (CMS HCC)     Elevated PSA     Epileptic seizure (CMS HCC)      Esophageal reflux     Essential hypertension     Hearing loss     Hyperlipidemia     Meniere's disease     PAD (peripheral artery disease) (CMS HCC)      Past Surgical History:   Procedure Laterality Date    HX CORONARY ARTERY BYPASS GRAFT       Family Medical History:       Problem Relation (Age of Onset)    Cancer Other    Elevated Lipids Mother    Emphysema Father    Heart Attack Father    Migraines Other    Sleep apnea Other            Social History     Tobacco Use    Smoking status: Never    Smokeless tobacco: Never   Substance Use Topics    Alcohol use: Never    Drug use: Never       Review  of Systems:  Review of Systems    Physical Exam:  Wt 81.6 kg (180 lb)   BMI 28.19 kg/m       Physical Exam  Constitutional:       Appearance: Normal appearance. He is well-developed, well-groomed and normal weight.   HENT:      Head: Normocephalic and atraumatic.      Right Ear: Hearing, tympanic membrane, ear canal and external ear normal.      Left Ear: Hearing, tympanic membrane, ear canal and external ear normal.      Nose: Septal deviation and mucosal edema present.      Right Turbinates: Enlarged.      Left Turbinates: Enlarged.      Mouth/Throat:      Lips: Pink.      Mouth: Mucous membranes are moist.      Pharynx: Oropharynx is clear. Uvula midline.   Eyes:      Extraocular Movements: Extraocular movements intact.   Neck:      Trachea: Phonation normal.      Comments: Biopsy site well healed  Pulmonary:      Effort: Pulmonary effort is normal.   Musculoskeletal:      Cervical back: Normal range of motion and neck supple.   Lymphadenopathy:      Cervical: No cervical adenopathy.   Skin:     General: Skin is warm.   Neurological:      Mental Status: He is alert and oriented to person, place, and time.      Cranial Nerves: Cranial nerves 2-12 are intact. No facial asymmetry.   Psychiatric:         Attention and Perception: Attention normal.         Mood and Affect: Mood normal.         Speech: Speech normal.          Behavior: Behavior normal. Behavior is cooperative.       Assessment:  ENCOUNTER DIAGNOSES     ICD-10-CM   1. Skin lesion of neck  L98.9   2. 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 ASNHL (asymmetrical sensorineural hearing loss)  H90.3   5. Gastroesophageal reflux disease without esophagitis  K21.9       Plan:  Medical records reviewed on 05/09/2022.  Pathology reviewed benign seborrheic keratosis  Nasal saline b.i.d.  Reflux precautions/diet modifications  Continue Prilosec daily  No orders of the defined types were placed in this encounter.    Follow-up in 6 months or sooner PRN  Lonia Farber, DO

## 2022-06-10 ENCOUNTER — Ambulatory Visit (INDEPENDENT_AMBULATORY_CARE_PROVIDER_SITE_OTHER): Payer: Self-pay | Admitting: Internal Medicine

## 2022-06-19 ENCOUNTER — Other Ambulatory Visit (INDEPENDENT_AMBULATORY_CARE_PROVIDER_SITE_OTHER): Payer: Self-pay | Admitting: NURSE PRACTITIONER

## 2022-06-20 NOTE — Telephone Encounter (Signed)
Refilled per protocol.

## 2022-07-03 ENCOUNTER — Other Ambulatory Visit (HOSPITAL_COMMUNITY): Payer: Medicare Other | Admitting: Internal Medicine

## 2022-07-03 ENCOUNTER — Encounter (INDEPENDENT_AMBULATORY_CARE_PROVIDER_SITE_OTHER): Payer: Self-pay | Admitting: Internal Medicine

## 2022-07-03 ENCOUNTER — Emergency Department
Admission: EM | Admit: 2022-07-03 | Discharge: 2022-07-03 | Disposition: A | Discharge status: Home / Self Care | Payer: Medicare Other | Attending: NURSE PRACTITIONER | Admitting: NURSE PRACTITIONER

## 2022-07-03 ENCOUNTER — Emergency Department (HOSPITAL_COMMUNITY): Payer: Medicare Other

## 2022-07-03 ENCOUNTER — Other Ambulatory Visit: Payer: Self-pay

## 2022-07-03 ENCOUNTER — Ambulatory Visit (INDEPENDENT_AMBULATORY_CARE_PROVIDER_SITE_OTHER): Payer: Medicare Other | Admitting: Internal Medicine

## 2022-07-03 VITALS — BP 144/74 | HR 66 | Temp 98.7°F | Ht 67.0 in | Wt 184.0 lb

## 2022-07-03 DIAGNOSIS — E559 Vitamin D deficiency, unspecified: Secondary | ICD-10-CM | POA: Insufficient documentation

## 2022-07-03 DIAGNOSIS — E785 Hyperlipidemia, unspecified: Secondary | ICD-10-CM

## 2022-07-03 DIAGNOSIS — I10 Essential (primary) hypertension: Secondary | ICD-10-CM | POA: Insufficient documentation

## 2022-07-03 DIAGNOSIS — K219 Gastro-esophageal reflux disease without esophagitis: Secondary | ICD-10-CM | POA: Insufficient documentation

## 2022-07-03 DIAGNOSIS — G40909 Epilepsy, unspecified, not intractable, without status epilepticus: Secondary | ICD-10-CM | POA: Insufficient documentation

## 2022-07-03 DIAGNOSIS — N200 Calculus of kidney: Secondary | ICD-10-CM

## 2022-07-03 DIAGNOSIS — N201 Calculus of ureter: Secondary | ICD-10-CM

## 2022-07-03 DIAGNOSIS — I2510 Atherosclerotic heart disease of native coronary artery without angina pectoris: Secondary | ICD-10-CM | POA: Insufficient documentation

## 2022-07-03 DIAGNOSIS — Z0000 Encounter for general adult medical examination without abnormal findings: Secondary | ICD-10-CM

## 2022-07-03 DIAGNOSIS — H9193 Unspecified hearing loss, bilateral: Secondary | ICD-10-CM | POA: Insufficient documentation

## 2022-07-03 DIAGNOSIS — I6529 Occlusion and stenosis of unspecified carotid artery: Secondary | ICD-10-CM

## 2022-07-03 DIAGNOSIS — N132 Hydronephrosis with renal and ureteral calculous obstruction: Secondary | ICD-10-CM | POA: Insufficient documentation

## 2022-07-03 DIAGNOSIS — R109 Unspecified abdominal pain: Secondary | ICD-10-CM | POA: Insufficient documentation

## 2022-07-03 DIAGNOSIS — F0390 Unspecified dementia without behavioral disturbance: Secondary | ICD-10-CM | POA: Insufficient documentation

## 2022-07-03 DIAGNOSIS — R110 Nausea: Secondary | ICD-10-CM | POA: Insufficient documentation

## 2022-07-03 DIAGNOSIS — Z1211 Encounter for screening for malignant neoplasm of colon: Secondary | ICD-10-CM

## 2022-07-03 DIAGNOSIS — F419 Anxiety disorder, unspecified: Secondary | ICD-10-CM

## 2022-07-03 LAB — COMPREHENSIVE METABOLIC PANEL, NON-FASTING
ALBUMIN/GLOBULIN RATIO: 1.6 — ABNORMAL HIGH (ref 0.8–1.4)
ALBUMIN/GLOBULIN RATIO: 1.7 — ABNORMAL HIGH (ref 0.8–1.4)
ALBUMIN: 4.4 g/dL (ref 3.5–5.7)
ALBUMIN: 4.3 g/dL (ref 3.5–5.7)
ALKALINE PHOSPHATASE: 76 U/L (ref 34–104)
ALKALINE PHOSPHATASE: 71 U/L (ref 34–104)
ALT (SGPT): 36 U/L (ref 7–52)
ALT (SGPT): 34 U/L (ref 7–52)
ANION GAP: 8 mmol/L (ref 4–13)
ANION GAP: 8 mmol/L (ref 4–13)
AST (SGOT): 31 U/L (ref 13–39)
AST (SGOT): 31 U/L (ref 13–39)
BILIRUBIN TOTAL: 0.5 mg/dL (ref 0.3–1.2)
BILIRUBIN TOTAL: 0.4 mg/dL (ref 0.3–1.2)
BUN/CREA RATIO: 22 (ref 6–22)
BUN/CREA RATIO: 21 (ref 6–22)
BUN: 24 mg/dL (ref 7–25)
BUN: 28 mg/dL — ABNORMAL HIGH (ref 7–25)
CALCIUM, CORRECTED: 9.3 mg/dL (ref 8.9–10.8)
CALCIUM, CORRECTED: 9.1 mg/dL (ref 8.9–10.8)
CALCIUM: 9.6 mg/dL (ref 8.6–10.3)
CALCIUM: 9.3 mg/dL (ref 8.6–10.3)
CHLORIDE: 106 mmol/L (ref 98–107)
CHLORIDE: 107 mmol/L (ref 98–107)
CO2 TOTAL: 25 mmol/L (ref 21–31)
CO2 TOTAL: 23 mmol/L (ref 21–31)
CREATININE: 1.08 mg/dL (ref 0.60–1.30)
CREATININE: 1.35 mg/dL — ABNORMAL HIGH (ref 0.60–1.30)
ESTIMATED GFR: 71 mL/min/{1.73_m2} (ref >59)
ESTIMATED GFR: 54 mL/min/{1.73_m2} — ABNORMAL LOW (ref >59)
GLOBULIN: 2.7 — ABNORMAL LOW (ref 2.9–5.4)
GLOBULIN: 2.6 — ABNORMAL LOW (ref 2.9–5.4)
GLUCOSE: 71 mg/dL — ABNORMAL LOW (ref 74–109)
GLUCOSE: 136 mg/dL — ABNORMAL HIGH (ref 74–109)
OSMOLALITY, CALCULATED: 280 mOsm/kg (ref 270–290)
OSMOLALITY, CALCULATED: 283 mOsm/kg (ref 270–290)
POTASSIUM: 4.1 mmol/L (ref 3.5–5.1)
POTASSIUM: 4.3 mmol/L (ref 3.5–5.1)
PROTEIN TOTAL: 7.1 g/dL (ref 6.4–8.9)
PROTEIN TOTAL: 6.9 g/dL (ref 6.4–8.9)
SODIUM: 139 mmol/L (ref 136–145)
SODIUM: 138 mmol/L (ref 136–145)

## 2022-07-03 LAB — URINALYSIS, MACROSCOPIC
BILIRUBIN: NEGATIVE mg/dL
BLOOD: 1 mg/dL — AB
GLUCOSE: NEGATIVE mg/dL
LEUKOCYTES: NEGATIVE WBCs/uL
NITRITE: NEGATIVE
PH: 5.5 (ref 5.0–9.0)
PROTEIN: 30 mg/dL — AB
SPECIFIC GRAVITY: 1.031 — ABNORMAL HIGH (ref 1.002–1.030)
UROBILINOGEN: 3 mg/dL — AB

## 2022-07-03 LAB — CBC WITH DIFF
BASOPHIL #: 0 10*3/uL (ref 0.00–0.10)
BASOPHIL #: 0.1 10*3/uL (ref 0.00–0.10)
BASOPHIL %: 0 % (ref 0–1)
BASOPHIL %: 1 % (ref 0–1)
EOSINOPHIL #: 0.3 10*3/uL (ref 0.00–0.50)
EOSINOPHIL #: 0.1 10*3/uL (ref 0.00–0.50)
EOSINOPHIL %: 3 %
EOSINOPHIL %: 1 %
HCT: 44.4 % (ref 36.7–47.1)
HCT: 43.2 % (ref 36.7–47.1)
HGB: 15.5 g/dL (ref 12.5–16.3)
HGB: 14.7 g/dL (ref 12.5–16.3)
LYMPHOCYTE #: 3 10*3/uL (ref 1.00–3.00)
LYMPHOCYTE #: 1.8 10*3/uL (ref 1.00–3.00)
LYMPHOCYTE %: 30 % (ref 16–44)
LYMPHOCYTE %: 14 % — ABNORMAL LOW (ref 16–44)
MCH: 32.3 pg (ref 23.8–33.4)
MCH: 31.6 pg (ref 23.8–33.4)
MCHC: 34.8 g/dL (ref 32.5–36.3)
MCHC: 34.1 g/dL (ref 32.5–36.3)
MCV: 92.6 fL (ref 73.0–96.2)
MCV: 92.6 fL (ref 73.0–96.2)
MONOCYTE #: 1.2 10*3/uL — ABNORMAL HIGH (ref 0.30–1.00)
MONOCYTE #: 1.3 10*3/uL — ABNORMAL HIGH (ref 0.30–1.00)
MONOCYTE %: 13 % (ref 5–13)
MONOCYTE %: 11 % (ref 5–13)
MPV: 7.9 fL (ref 7.4–11.4)
MPV: 7.3 fL — ABNORMAL LOW (ref 7.4–11.4)
NEUTROPHIL #: 5.2 10*3/uL (ref 1.85–7.80)
NEUTROPHIL #: 9.5 10*3/uL — ABNORMAL HIGH (ref 1.85–7.80)
NEUTROPHIL %: 54 % (ref 43–77)
NEUTROPHIL %: 74 % (ref 43–77)
PLATELETS: 228 10*3/uL (ref 140–440)
PLATELETS: 213 10*3/uL (ref 140–440)
RBC: 4.79 10*6/uL (ref 4.06–5.63)
RBC: 4.66 10*6/uL (ref 4.06–5.63)
RDW: 13.4 % (ref 12.1–16.2)
RDW: 13.5 % (ref 12.1–16.2)
WBC: 9.8 10*3/uL (ref 3.6–10.2)
WBC: 12.8 10*3/uL — ABNORMAL HIGH (ref 3.6–10.2)

## 2022-07-03 LAB — LIPID PANEL
CHOL/HDL RATIO: 5
CHOLESTEROL: 135 mg/dL (ref <200)
HDL CHOL: 27 mg/dL (ref 23–92)
LDL CALC: 38 mg/dL (ref 0–100)
TRIGLYCERIDES: 350 mg/dL — ABNORMAL HIGH (ref <=150)
VLDL CALC: 70 mg/dL — ABNORMAL HIGH (ref 0–50)

## 2022-07-03 LAB — THYROID STIMULATING HORMONE (SENSITIVE TSH): TSH: 2.506 u[IU]/mL (ref 0.450–5.330)

## 2022-07-03 LAB — VITAMIN B12: VITAMIN B 12: 244 pg/mL (ref 180–914)

## 2022-07-03 LAB — URINALYSIS, MICROSCOPIC
BACTERIA: NEGATIVE /hpf
RBCS: 130 /hpf — ABNORMAL HIGH (ref <4)
SQUAMOUS EPITHELIAL: <1 /hpf (ref <28)
WBCS: 5 /hpf (ref <6)

## 2022-07-03 LAB — LIPASE: LIPASE: 41 U/L (ref 11–82)

## 2022-07-03 LAB — VITAMIN D 25 TOTAL: VITAMIN D: 66 ng/mL (ref 30–100)

## 2022-07-03 LAB — LACTIC ACID LEVEL W/ REFLEX FOR LEVEL >2.0: LACTIC ACID: 1 mmol/L (ref 0.5–2.2)

## 2022-07-03 MED ORDER — ONDANSETRON 4 MG DISINTEGRATING TABLET
4.0000 mg | ORAL_TABLET | Freq: Three times a day (TID) | ORAL | 0 refills | Status: AC | PRN
Start: 2022-07-03 — End: ?

## 2022-07-03 MED ORDER — SODIUM CHLORIDE 0.9 % IV BOLUS
1000.0000 mL | INJECTION | Status: AC
Start: 2022-07-03 — End: 2022-07-03
  Administered 2022-07-03: 1000 mL via INTRAVENOUS
  Administered 2022-07-03: 0 mL via INTRAVENOUS

## 2022-07-03 MED ORDER — TAMSULOSIN 0.4 MG CAPSULE
0.4000 mg | ORAL_CAPSULE | Freq: Every day | ORAL | 0 refills | Status: DC
Start: 2022-07-03 — End: 2022-07-14

## 2022-07-03 MED ORDER — AMLODIPINE 10 MG TABLET
10.0000 mg | ORAL_TABLET | Freq: Every day | ORAL | 3 refills | Status: DC
Start: 2022-07-03 — End: 2023-04-15

## 2022-07-03 MED ORDER — SODIUM CHLORIDE 0.9 % (FLUSH) INJECTION SYRINGE
3.0000 mL | INJECTION | INTRAMUSCULAR | Status: DC | PRN
Start: 2022-07-03 — End: 2022-07-04

## 2022-07-03 MED ORDER — SODIUM CHLORIDE 0.9 % (FLUSH) INJECTION SYRINGE
3.0000 mL | INJECTION | Freq: Three times a day (TID) | INTRAMUSCULAR | Status: DC
Start: 2022-07-03 — End: 2022-07-04
  Administered 2022-07-03: 3 mL

## 2022-07-03 MED ORDER — HYDROCODONE 5 MG-ACETAMINOPHEN 325 MG TABLET
1.0000 | ORAL_TABLET | Freq: Two times a day (BID) | ORAL | 0 refills | Status: AC | PRN
Start: 2022-07-03 — End: 2022-07-06

## 2022-07-03 MED ORDER — FLUOXETINE 40 MG CAPSULE
40.0000 mg | ORAL_CAPSULE | Freq: Every day | ORAL | 3 refills | Status: DC
Start: 2022-07-03 — End: 2023-04-15

## 2022-07-03 NOTE — ED Nurses Note (Signed)
DISCHARGE INSTRUCTIONS GIVEN, STRAINER GIVEN, IV REMOVED BLEEDING CONTROLLED.

## 2022-07-03 NOTE — ED Triage Notes (Signed)
Right flank pain with vomiting today

## 2022-07-03 NOTE — ED APP Handoff Note (Signed)
[facility]  Emergency Department  Provider in Triage Note    Name: Jason Jacobs  Age: 77 y.o.  Gender: male     Subjective:   Jason Jacobs is a 77 y.o. male who presents with complaint of Flank Pain  .  Pt to the ED for  R flank pain and vomiting today  Objective:   Filed Vitals:    07/03/22 2005   BP: (!) 143/78   Pulse: 83   Resp: 18   Temp: 36.2 C (97.1 F)   SpO2: 96%      Focused Physical Exam shows R CVA tenderness noted.   Plan:  Please see initial orders and work-up below.  This is to be continued with full evaluation in the main Emergency Department.     No current facility-administered medications for this encounter.     Results for orders placed or performed during the hospital encounter of 07/03/22 (from the past 24 hour(s))   CBC/DIFF    Narrative    The following orders were created for panel order CBC/DIFF.  Procedure                               Abnormality         Status                     ---------                               -----------         ------                     CBC WITH [id number]                                                                 Please view results for these tests on the individual orders.   URINALYSIS, MACROSCOPIC AND MICROSCOPIC W/CULTURE REFLEX [PRN ONLY]    Specimen: Urine, Clean Catch    Narrative    The following orders were created for panel order URINALYSIS, MACROSCOPIC AND MICROSCOPIC W/CULTURE REFLEX [PRN ONLY].  Procedure                               Abnormality         Status                     ---------                               -----------         ------                     URINALYSIS, MACROSCOPIC[[id number]]  URINALYSIS, MICROSCOPIC[[id number]]                                                       Please view results for these tests on the individual orders.   Results for orders placed or performed in visit on 07/03/22 (from the past 24 hour(s))   COMPREHENSIVE  METABOLIC PANEL, NON-FASTING   Result Value Ref Range    SODIUM 139 136 - 145 mmol/L    POTASSIUM 4.1 3.5 - 5.1 mmol/L    CHLORIDE 106 98 - 107 mmol/L    CO2 TOTAL 25 21 - 31 mmol/L    ANION GAP 8 4 - 13 mmol/L    BUN 24 7 - 25 mg/dL    CREATININE 1.08 0.60 - 1.30 mg/dL    BUN/CREA RATIO 22 6 - 22    ESTIMATED GFR 71 >59 mL/min/1.90m2    ALBUMIN 4.4 3.5 - 5.7 g/dL    CALCIUM 9.6 8.6 - 10.3 mg/dL    GLUCOSE 71 (L) 74 - 109 mg/dL    ALKALINE PHOSPHATASE 76 34 - 104 U/L    ALT (SGPT) 36 7 - 52 U/L    AST (SGOT) 31 13 - 39 U/L    BILIRUBIN TOTAL 0.5 0.3 - 1.2 mg/dL    PROTEIN TOTAL 7.1 6.4 - 8.9 g/dL    ALBUMIN/GLOBULIN RATIO 1.6 (H) 0.8 - 1.4    OSMOLALITY, CALCULATED 280 270 - 290 mOsm/kg    CALCIUM, CORRECTED 9.3 8.9 - 10.8 mg/dL    GLOBULIN 2.7 (L) 2.9 - 5.4    Narrative    Estimated Glomerular Filtration Rate (eGFR) is calculated using the CKD-EPI (2021) equation, intended for patients 123years of age and older. If gender is not documented or "unknown", there will be no eGFR calculation.     CBC/DIFF    Narrative    The following orders were created for panel order CBC/DIFF.  Procedure                               Abnormality         Status                     ---------                               -----------         ------                     CBC WITH [id number]               Abnormal            Final result                 Please view results for these tests on the individual orders.   LIPID PANEL   Result Value Ref Range    CHOLESTEROL 135 <200 mg/dL    TRIGLYCERIDES 350 (H) <=150 mg/dL    HDL CHOL 27 23 - 92 mg/dL    LDL CALC 38 0 - 100 mg/dL    VLDL CALC 70 (H) 0 - 50 mg/dL    CHOL/HDL RATIO 5.0  THYROID STIMULATING HORMONE (SENSITIVE TSH)   Result Value Ref Range    TSH 2.506 0.450 - 5.330 uIU/mL   VITAMIN D 25 TOTAL   Result Value Ref Range    VITAMIN D 66 30 - 100 ng/mL   VITAMIN B12   Result Value Ref Range    VITAMIN B 12 244 180 - 914 pg/mL   CBC WITH DIFF   Result Value Ref Range    WBC 9.8  3.6 - 10.2 x10^3/uL    RBC 4.79 4.06 - 5.63 x10^6/uL    HGB 15.5 12.5 - 16.3 g/dL    HCT 44.4 36.7 - 47.1 %    MCV 92.6 73.0 - 96.2 fL    MCH 32.3 23.8 - 33.4 pg    MCHC 34.8 32.5 - 36.3 g/dL    RDW 13.4 12.1 - 16.2 %    PLATELETS 228 140 - 440 x10^3/uL    MPV 7.9 7.4 - 11.4 fL    NEUTROPHIL % 54 43 - 77 %    LYMPHOCYTE % 30 16 - 44 %    MONOCYTE % 13 5 - 13 %    EOSINOPHIL % 3 %    BASOPHIL % 0 0 - 1 %    NEUTROPHIL # 5.20 1.85 - 7.80 x10^3/uL    LYMPHOCYTE # 3.00 1.00 - 3.00 x10^3/uL    MONOCYTE # 1.20 (H) 0.30 - 1.00 x10^3/uL    EOSINOPHIL # 0.30 0.00 - 0.50 x10^3/uL    BASOPHIL # 0.00 0.00 - 0.10 x10^3/uL             Farris Blash, FNP-C

## 2022-07-03 NOTE — Progress Notes (Signed)
FAMILY MEDICINE, MEDICAL OFFICE BUILDING  [place] [address]      Jason Jacobs  1946-04-20  [id number]    Date of Service: 07/03/2022  3:00 PM EST    Chief complaint:   Chief Complaint   Patient presents with    New Patient    Labs Only       Subjective:     This is a case of a 77 y.o. year old male who comes in today for to establish care.    Recent lab results not available. He has taken vit D long term.    He has had memory issues since around 2014.  He enjoys word puzzles.  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 He worked for [employer] for 40 years.        He was previously followed by Dr. Candelaria Stagers for neurology but he retired.  He was diagnosed with partial complex seizures in 2014.  Three is no hx listed of stroke and no brain imaging available to me.     He has not taken any meds for his cognitive impairment.  He does not particularly want medication but his wife questions me.  He appears to take care of his ADL but has significant short term memory issues.      He follows with his dermatologist   Vascular surgery f/u w/ Dr. Tonna Corner- 10/23:  Duplex  R ICA 50-69% velocities of 247/41. Left ICA with no significant restenosis following CEA.         ROS:  Constitutional - no appetite + weight gain, no fatigue, no fevers, chills, or night sweats  Respiratory - no dyspnea or cough  Cardiovascular - no chest pain or palpitations  Gastrointestinal - no nausea, vomiting, diarrhea, or constipation, no dyspepsia  Endocrine - no heat or cold intolerance, no polyuria, polydipsia, or polyphagia  Skin - no rashes, color changes, or lesions  Musculoskeletal - no arthralgias or myalgias  Genitourinary - no urinary frequency or dysuria, no genital discharge  Neurologic - no vision or hearing changes, no weakness, no parasthesias   Psychiatric - mood has been appropriate, no depression or anxiety    Active Ambulatory Problems     Diagnosis Date Noted    Arteriosclerotic cardiovascular disease  (ASCVD) 05/31/2007    Carotid artery stenosis 05/31/2007    Hyperlipidemia 05/31/2007    Epileptic seizure (CMS [place]) 01/30/2022    Essential hypertension 01/30/2022    Esophageal reflux 01/30/2022    Anxiety 01/30/2022    Dementia (CMS [place]) 01/30/2022    Nephrolithiasis 07/03/2022    Bilateral hearing loss, unspecified hearing loss type 07/03/2022     Resolved Ambulatory Problems     Diagnosis Date Noted    Carotid stenosis, left 09/27/2015     Past Medical History:   Diagnosis Date    Chronic obstructive airway disease (CMS HCC)     Coronary artery disease     Elevated PSA     Hearing loss     Meniere's disease     PAD (peripheral artery disease) (CMS HCC)        Past Surgical History:   Procedure Laterality Date    HX CORONARY ARTERY BYPASS GRAFT             Family Medical History:       Problem Relation (Age of Onset)    Cancer Other    Elevated Lipids Mother    Emphysema Father  Heart Attack Father    Migraines Other    Sleep apnea Other              Social History     Socioeconomic History    Marital status: Married     Spouse name: Not on file    Number of children: Not on file    Years of education: Not on file    Highest education level: Not on file   Occupational History    Not on file   Tobacco Use    Smoking status: Never    Smokeless tobacco: Never   Substance and Sexual Activity    Alcohol use: Never    Drug use: Never    Sexual activity: Not on file   Other Topics Concern    Not on file   Social History Narrative    Not on file     Social Determinants of Health     Financial Resource Strain: Not on file   Transportation Needs: Not on file   Social Connections: Not on file   Intimate Partner Violence: Not on file   Housing Stability: Not on file       Current Outpatient Medications   Medication Sig    amLODIPine (NORVASC) 10 mg Oral Tablet Take 1 Tablet (10 mg total) by mouth Once a day Indications: high blood pressure    aspirin (ECOTRIN) 81 mg Oral Tablet, Delayed Release (E.C.) Take 1 Tablet (81 mg  total) by mouth    ergocalciferol, vitamin D2, (DRISDOL) 1,250 mcg (50,000 unit) Oral Capsule TAKE 1 CAPSULE BY MOUTH ONE TIME PER WEEK    FLUoxetine (PROZAC) 40 mg Oral Capsule Take 1 Capsule (40 mg total) by mouth Once a day Indications: anxiousness associated with depression    Levetiracetam 750 mg Oral Tablet Sustained Release 24 hr TAKE 1 TABLET TWICE A DAY    omeprazole (PRILOSEC) 20 mg Oral Capsule, Delayed Release(E.C.)     Perindopril Erbumine (ACEON) 4 mg Oral Tablet Take 1 Tablet (4 mg total) by mouth Once a day    rosuvastatin (CRESTOR) 5 mg Oral Tablet        Objective:     BP (!) 144/74 (Site: Right, Patient Position: Sitting, Cuff Size: Adult)   Pulse 66   Temp 37.1 C (98.7 F) (Temporal)   Ht 1.702 m (5' 7"$ )   Wt 83.5 kg (184 lb)   SpO2 94%   BMI 28.82 kg/m         General appearance: alert, cooperative, in no acute distress  HEENT: PERRL; no LAD or thyromegaly, neck supple.  Lungs: clear to auscultation bilaterally; no wheezes or rhonchi   Heart: regular rate and rhythm; normal S1 & S2; no murmur  Abdomen: soft, non-tender, not distended; bowel sounds present.  No palpable masses.  Extremities: extremities normal ROM, no cyanosis or edema , no rash  Psych: alert and oriented x 3  Neuro: CN 2-12 grossly intact  Peripheral motor and sensory exams are grossly normal    Assessment/Plan         ICD-10-CM    1. Arteriosclerotic cardiovascular disease (ASCVD)  I25.10 LIPID PANEL      2. Dementia, unspecified dementia severity, unspecified dementia type, unspecified whether behavioral, psychotic, or mood disturbance or anxiety (CMS HCC)  F03.90 THYROID STIMULATING HORMONE (SENSITIVE TSH)   Wife asks about meds for his memory but I do not think there is anything that is available at this time. Will obtain opinion from Dr.  Cox.  Will see if there is a previous CT or MRI brain at [facility].  Could not find one from [place].   VITAMIN B12      3. Epileptic seizure (CMS [place])  G40.909 CBC/DIFF     Refer to  [place]      4. Essential hypertension  I10 COMPREHENSIVE METABOLIC PANEL, NON-FASTING     amLODIPine (NORVASC) 10 mg Oral Tablet      5. Hyperlipidemia  E78.5 THYROID STIMULATING HORMONE (SENSITIVE TSH)      6. Esophageal reflux  K21.9 CBC/DIFF      7. Vitamin D deficiency  E55.9 VITAMIN D 25 TOTAL      8. Carotid artery stenosis  I65.29    Rt 50-69%    9. Nephrolithiasis  N20.0    He has only passed one stone   10. Bilateral hearing loss, unspecified hearing loss type  H91.93       11. Screening for colon cancer  Z12.11 Cologuard      12. Anxiety  F41.9 FLUoxetine (PROZAC) 40 mg Oral Capsule          Orders Placed This Encounter    COMPREHENSIVE METABOLIC PANEL, NON-FASTING    CBC/DIFF    LIPID PANEL    THYROID STIMULATING HORMONE (SENSITIVE TSH)    VITAMIN D 25 TOTAL    VITAMIN B12    Cologuard    CBC WITH DIFF    Refer to NEUROLOGY - [place] [facility]    amLODIPine (NORVASC) 10 mg Oral Tablet    FLUoxetine (PROZAC) 40 mg Oral Capsule       Health Maintenance: He has not had any type of screening for colon cancer.      He needs Pneumovax 23 & Tdap.  Decrease salt in diet, walk more.     The patient was given the opportunity to ask questions and those questions were answered to the patient's satisfaction. The patient was encouraged to call with any additional questions or concerns. Instructed patient to call back if symptoms worse.   Discussed with patient effects and side effects of medications. Medication safety was discussed. A copy of the patient's medication list was printed and given to the patient. A good faith effort was made to reconcile the patient's medications.       Follow up: Return in about 2 months (around 09/01/2022).    Estill Dooms, MD

## 2022-07-03 NOTE — Nursing Note (Signed)
07/03/22 1505   Fall Risk Assessment   Do you feel unsteady when standing or walking? No   Do you worry about falling? No   Have you fallen in the past year? No

## 2022-07-03 NOTE — ED Provider Notes (Signed)
[facility]  Emergency Department  Advanced Practice Provider Note      CHIEF COMPLAINT  Chief Complaint   Patient presents with    Flank Pain     HISTORY OF PRESENT ILLNESS  Jason Jacobs, date of birth 1945-07-30, is a 77 y.o. male who presented to the Emergency Department.    Patient here with wife at bedside.  Patient started complaining of right flank pain earlier this afternoon.  Describes pain as a constant ache.  Denies any exacerbating or relieving factors.  Patient currently rates pain a 3/10.  Wife reports that she did give patient Tylenol just prior to ER arrival.  Patient also complaining of some nausea and vomiting prior to ER arrival.  Denies any nausea at this time.  Denies any recent fever or chills.  Denies any abdominal pain.  Denies any dysuria or hematuria.  Does report history of renal calculus.  Reports his last stone was 10 years ago.      PAST MEDICAL/SURGICAL/FAMILY/SOCIAL HISTORY  Past Medical History:   Diagnosis Date    Anxiety     Carotid stenosis, left     Chronic obstructive airway disease (CMS HCC)     Coronary artery disease     Dementia (CMS HCC)     Elevated PSA     Epileptic seizure (CMS HCC)     Esophageal reflux     Essential hypertension     Hearing loss     Hyperlipidemia     Meniere's disease     PAD (peripheral artery disease) (CMS HCC)        Past Surgical History:   Procedure Laterality Date    HX CORONARY ARTERY BYPASS GRAFT         Family Medical History:       Problem Relation (Age of Onset)    Cancer Other    Elevated Lipids Mother    Emphysema Father    Heart Attack Father    Migraines Other    Sleep apnea Other          Social History     Socioeconomic History    Marital status: Married   Tobacco Use    Smoking status: Never    Smokeless tobacco: Never   Substance and Sexual Activity    Alcohol use: Never    Drug use: Never      ALLERGIES  No Known Allergies    PHYSICAL EXAM  VITAL SIGNS:  Filed Vitals:    07/03/22 2005   BP: (!) 143/78   Pulse: 83    Resp: 18   Temp: 36.2 C (97.1 F)   SpO2: 96%     Constitutional: Alert and oriented. Well appearing. Average body weight. No distress noted.   Cardiovascular: RRR, S1, S2. Intact distal pulses.  Pulmonary/Chest: Breath sounds clear and equal bilaterally. No respiratory distress. No wheezes, rales or chest tenderness.   Abdominal: Bowel sound normal. Abdomen soft, no tenderness, rebound or guarding.  No CVA tenderness             Musculoskeletal: No edema, tenderness or deformity. Normal muscle tone and strength.   Skin: warm and dry. No rash, redness, or bruising  Psychiatric: normal mood and affect. Behavior is normal.   Neurological: Alert, oriented. Normal gait. No focal weakness noted. No sensory deficit    Nursing notes reviewed.     PROCEDURES    DIAGNOSTICS  Labs:  Labs listed below were reviewed and interpreted by me.  Results for orders placed or performed during the hospital encounter of 07/03/22   CBC WITH DIFF   Result Value Ref Range    WBC 12.8 (H) 3.6 - 10.2 x10^3/uL    RBC 4.66 4.06 - 5.63 x10^6/uL    HGB 14.7 12.5 - 16.3 g/dL    HCT 43.2 36.7 - 47.1 %    MCV 92.6 73.0 - 96.2 fL    MCH 31.6 23.8 - 33.4 pg    MCHC 34.1 32.5 - 36.3 g/dL    RDW 13.5 12.1 - 16.2 %    PLATELETS 213 140 - 440 x10^3/uL    MPV 7.3 (L) 7.4 - 11.4 fL    NEUTROPHIL % 74 43 - 77 %    LYMPHOCYTE % 14 (L) 16 - 44 %    MONOCYTE % 11 5 - 13 %    EOSINOPHIL % 1 %    BASOPHIL % 1 0 - 1 %    NEUTROPHIL # 9.50 (H) 1.85 - 7.80 x10^3/uL    LYMPHOCYTE # 1.80 1.00 - 3.00 x10^3/uL    MONOCYTE # 1.30 (H) 0.30 - 1.00 x10^3/uL    EOSINOPHIL # 0.10 0.00 - 0.50 x10^3/uL    BASOPHIL # 0.10 0.00 - 0.10 x10^3/uL   Results for orders placed or performed in visit on 07/03/22   COMPREHENSIVE METABOLIC PANEL, NON-FASTING   Result Value Ref Range    SODIUM 139 136 - 145 mmol/L    POTASSIUM 4.1 3.5 - 5.1 mmol/L    CHLORIDE 106 98 - 107 mmol/L    CO2 TOTAL 25 21 - 31 mmol/L    ANION GAP 8 4 - 13 mmol/L    BUN 24 7 - 25 mg/dL    CREATININE 1.08 0.60 -  1.30 mg/dL    BUN/CREA RATIO 22 6 - 22    ESTIMATED GFR 71 >59 mL/min/1.31m2    ALBUMIN 4.4 3.5 - 5.7 g/dL    CALCIUM 9.6 8.6 - 10.3 mg/dL    GLUCOSE 71 (L) 74 - 109 mg/dL    ALKALINE PHOSPHATASE 76 34 - 104 U/L    ALT (SGPT) 36 7 - 52 U/L    AST (SGOT) 31 13 - 39 U/L    BILIRUBIN TOTAL 0.5 0.3 - 1.2 mg/dL    PROTEIN TOTAL 7.1 6.4 - 8.9 g/dL    ALBUMIN/GLOBULIN RATIO 1.6 (H) 0.8 - 1.4    OSMOLALITY, CALCULATED 280 270 - 290 mOsm/kg    CALCIUM, CORRECTED 9.3 8.9 - 10.8 mg/dL    GLOBULIN 2.7 (L) 2.9 - 5.4   LIPID PANEL   Result Value Ref Range    CHOLESTEROL 135 <200 mg/dL    TRIGLYCERIDES 350 (H) <=150 mg/dL    HDL CHOL 27 23 - 92 mg/dL    LDL CALC 38 0 - 100 mg/dL    VLDL CALC 70 (H) 0 - 50 mg/dL    CHOL/HDL RATIO 5.0    THYROID STIMULATING HORMONE (SENSITIVE TSH)   Result Value Ref Range    TSH 2.506 0.450 - 5.330 uIU/mL   VITAMIN D 25 TOTAL   Result Value Ref Range    VITAMIN D 66 30 - 100 ng/mL   VITAMIN B12   Result Value Ref Range    VITAMIN B 12 244 180 - 914 pg/mL   CBC WITH DIFF   Result Value Ref Range    WBC 9.8 3.6 - 10.2 x10^3/uL    RBC 4.79 4.06 - 5.63 x10^6/uL    HGB  15.5 12.5 - 16.3 g/dL    HCT 44.4 36.7 - 47.1 %    MCV 92.6 73.0 - 96.2 fL    MCH 32.3 23.8 - 33.4 pg    MCHC 34.8 32.5 - 36.3 g/dL    RDW 13.4 12.1 - 16.2 %    PLATELETS 228 140 - 440 x10^3/uL    MPV 7.9 7.4 - 11.4 fL    NEUTROPHIL % 54 43 - 77 %    LYMPHOCYTE % 30 16 - 44 %    MONOCYTE % 13 5 - 13 %    EOSINOPHIL % 3 %    BASOPHIL % 0 0 - 1 %    NEUTROPHIL # 5.20 1.85 - 7.80 x10^3/uL    LYMPHOCYTE # 3.00 1.00 - 3.00 x10^3/uL    MONOCYTE # 1.20 (H) 0.30 - 1.00 x10^3/uL    EOSINOPHIL # 0.30 0.00 - 0.50 x10^3/uL    BASOPHIL # 0.00 0.00 - 0.10 x10^3/uL     Radiology:       ED [place]      ED Course as of 07/03/22 2056   Thu Jul 03, 2022   2055 WBC(!): 12.8  Slightly elevated. PMNs 74   2056 LACTIC ACID: 1.0  Normal   2056 LIPASE: 41  Normal   2056 CREATININE(!): 1.35  GFR 54 BUN 28      Medical Decision  Making  Patient here with wife at bedside.  Patient started complaining of right flank pain earlier this afternoon.  Describes pain as a constant ache.  Denies any exacerbating or relieving factors.  Patient currently rates pain a 3/10.  Wife reports that she did give patient Tylenol just prior to ER arrival.  Patient also complaining of some nausea and vomiting prior to ER arrival.  Denies any nausea at this time.  Denies any recent fever or chills.  Denies any abdominal pain.  Denies any dysuria or hematuria.  Does report history of renal calculus.  Reports his last stone was 10 years ago.    Patient's list of differential diagnosis includes but is not limited to renal calculi, muscle strain, appendicitis, cholecystitis, UTI, pyelonephritis, hydronephrosis, gastritis, gastroenteritis    Patient's white count is slightly elevated at 12.8 and neutrophils 74.  Lactic acid is normal at 1.0.  Patient's renal function shows creatinine is slightly elevated at 1.35, GFR 54 and BUN 28.  Patient will be given 1 L IV fluids.  Patient continues to rate pain a 2 to 3/10. Eager to be discharged home. Will discharge patient with Flomax, Zofran and hydrocodone for pain.  Also encouraged patient to call Dr. Norm Parcel office in the morning to set up follow-up appointment.  Given strict return to ED precautions    Amount and/or Complexity of Data Reviewed  Independent Historian: caregiver  Labs: ordered. Decision-making details documented in ED Course.  Radiology: ordered. Decision-making details documented in ED Course.  ECG/medicine tests: independent interpretation performed.    Risk  Prescription drug management.      CLINICAL IMPRESSION  Clinical Impression   Right flank pain (Primary)   Nausea   Calculus of right ureter     DISPOSITION  Discharged       DISCHARGE MEDICATIONS  Current Discharge Medication List        START taking these medications    Details   HYDROcodone-acetaminophen (NORCO) 5-325 mg Oral Tablet Take 1 Tablet  by mouth Twice per day as needed for Pain for up to 3 days  Qty: 6 Tablet, Refills: 0      ondansetron (ZOFRAN ODT) 4 mg Oral Tablet, Rapid Dissolve Take 1 Tablet (4 mg total) by mouth Every 8 hours as needed for Nausea/Vomiting  Qty: 12 Tablet, Refills: 0      tamsulosin (FLOMAX) 0.4 mg Oral Capsule Take 1 Capsule (0.4 mg total) by mouth Once a day  Qty: 30 Capsule, Refills: 0             Hewitt Shorts, FNP-C 07/03/2022, 20:48   [facility]  Department of Emergency Medicine  [facility]    This note was partially generated using MModal Fluency Direct system, and there may be some incorrect words, spellings, and punctuation that were not noted in checking the note before saving.    -----

## 2022-07-03 NOTE — ED Nurses Note (Incomplete)
PT C/O RIGHT FLANK PAIN.  PT STATES STARTED EARLIER TODAY.  PT DENIES DIFFICULTY VOIDING.  PT STATES He HAS HX OF KIDNEY STONES.  PT DENIES DIARRHEA, BUT ADMITS TO NAUSEA AND VOMITING.  PT HAS CARDIAC HX.

## 2022-07-03 NOTE — Discharge Instructions (Signed)
Continue to monitor symptoms.  Take medications as prescribed.  Make sure you are staying hydrated with plenty of fluids. Call urology office tomorrow to set up follow up appointment. Follow up with your family doctor.  Return to ED for worsening symptoms.    [facility] [phone]

## 2022-07-03 NOTE — Nursing Note (Signed)
07/03/22 1504   Recent Weight Change   Have you had a recent unexplained weight loss or gain? N   Domestic Violence   Because we are aware of abuse and domestic violence today, we ask all patients: Are you being hurt, hit, or frightened by anyone at your home or in your life?  N   Basic Needs   Do you have any basic needs within your home that are not being met? (such as Food, Shelter, Games developer, Tranportation, paying for bills and/or medications) N

## 2022-07-04 ENCOUNTER — Encounter (INDEPENDENT_AMBULATORY_CARE_PROVIDER_SITE_OTHER): Payer: Self-pay | Admitting: Internal Medicine

## 2022-07-04 ENCOUNTER — Other Ambulatory Visit (INDEPENDENT_AMBULATORY_CARE_PROVIDER_SITE_OTHER): Payer: Self-pay | Admitting: Internal Medicine

## 2022-07-04 DIAGNOSIS — E559 Vitamin D deficiency, unspecified: Secondary | ICD-10-CM

## 2022-07-04 DIAGNOSIS — N200 Calculus of kidney: Secondary | ICD-10-CM

## 2022-07-04 MED ORDER — ERGOCALCIFEROL (VITAMIN D2) 1,250 MCG (50,000 UNIT) CAPSULE
50000.0000 [IU] | ORAL_CAPSULE | ORAL | 3 refills | Status: DC
Start: 2022-07-04 — End: 2023-04-29

## 2022-07-04 NOTE — Result Encounter Note (Signed)
Her blood work was okay except her vit. D level was low and I sent in high dose WEEKly RX to [facility] for her.

## 2022-07-04 NOTE — Result Encounter Note (Signed)
Yes, I sent in high dose vitamin D for her to take weekly and for him to only take every other week.    He does not need CT scan abd. As he had it in ER last night.  I sent her a portal message letting her know that he already had the CT and what it showed.

## 2022-07-05 LAB — URINE CULTURE,ROUTINE: URINE CULTURE: NO GROWTH

## 2022-07-08 ENCOUNTER — Ambulatory Visit (INDEPENDENT_AMBULATORY_CARE_PROVIDER_SITE_OTHER): Payer: Medicare Other | Admitting: Student in an Organized Health Care Education/Training Program

## 2022-07-14 ENCOUNTER — Encounter (INDEPENDENT_AMBULATORY_CARE_PROVIDER_SITE_OTHER): Payer: Self-pay | Admitting: Physician Assistant

## 2022-07-14 ENCOUNTER — Ambulatory Visit (INDEPENDENT_AMBULATORY_CARE_PROVIDER_SITE_OTHER): Payer: Medicare Other

## 2022-07-14 ENCOUNTER — Other Ambulatory Visit: Payer: Medicare Other | Attending: Physician Assistant | Admitting: Physician Assistant

## 2022-07-14 ENCOUNTER — Ambulatory Visit (INDEPENDENT_AMBULATORY_CARE_PROVIDER_SITE_OTHER): Payer: Medicare Other | Admitting: Physician Assistant

## 2022-07-14 ENCOUNTER — Other Ambulatory Visit: Payer: Self-pay

## 2022-07-14 VITALS — BP 112/57 | HR 80 | Ht 68.0 in | Wt 183.0 lb

## 2022-07-14 DIAGNOSIS — Z87442 Personal history of urinary calculi: Secondary | ICD-10-CM

## 2022-07-14 DIAGNOSIS — N400 Enlarged prostate without lower urinary tract symptoms: Secondary | ICD-10-CM

## 2022-07-14 DIAGNOSIS — N201 Calculus of ureter: Secondary | ICD-10-CM

## 2022-07-14 LAB — PSA, DIAGNOSTIC: PSA: 0.59 ng/mL (ref <=4.00)

## 2022-07-14 MED ORDER — TAMSULOSIN 0.4 MG CAPSULE
0.4000 mg | ORAL_CAPSULE | Freq: Every day | ORAL | 11 refills | Status: DC
Start: 2022-07-14 — End: 2023-01-12

## 2022-07-14 NOTE — Progress Notes (Signed)
[facility]    Progress Note    Name: Jason Jacobs MRN:  [id number]   Date: 07/14/2022 Age: 77 y.o.       Chief Complaint: New Patient (Obstructing  14m mid right ureteral stone)    Subjective:   Patient is a 77yo male whom presents to the clinic for ureterolithiasis  on CT Abdomen/Pelvis 07/03/22. Patient with right flank pain 07/03/2022 x 1 day and has since resolved. Denies dysuria, flank pain,fever, chills, nausea or vomiting.History of renal calculus with lithotripsy. Patient denies history of tobacco use. Patient denies personal or family history of urinary malignancy. Patient denies occupational chemical exposure. Patient denies fevers, chills, nausea, vomiting, hematuria, dysuria, flank pain, incontinence, dribbling, hesitancy, suprapubic pain, headaches, vision changes, shortness of breath, chest pain.  Patient feels that he has passed stone.  Objective :  BP (!) 112/57 (Site: Right, Patient Position: Sitting, Cuff Size: Adult)   Pulse 80   Ht 1.727 m (5' 8"$ )   Wt 83 kg (183 lb)   BMI 27.83 kg/m       Gen: NAD, alert  Pulm: unlabored at rest  CV: palpable pulses  Abd: soft, Nt/ND  GU: no suprapubic tenderness, no CVAT   Data reviewed:    Current Outpatient Medications   Medication Sig    amLODIPine (NORVASC) 10 mg Oral Tablet Take 1 Tablet (10 mg total) by mouth Once a day Indications: high blood pressure    aspirin (ECOTRIN) 81 mg Oral Tablet, Delayed Release (E.C.) Take 1 Tablet (81 mg total) by mouth    ergocalciferol, vitamin D2, (DRISDOL) 1,250 mcg (50,000 unit) Oral Capsule Take 1 Capsule (50,000 Units total) by mouth Every 14 days Indications: vitamin D deficiency (high dose therapy)    FLUoxetine (PROZAC) 40 mg Oral Capsule Take 1 Capsule (40 mg total) by mouth Once a day Indications: anxiousness associated with depression    Levetiracetam 750 mg Oral Tablet Sustained Release 24 hr TAKE 1 TABLET TWICE A DAY    omeprazole (PRILOSEC) 20 mg Oral Capsule, Delayed  Release(E.C.)     ondansetron (ZOFRAN ODT) 4 mg Oral Tablet, Rapid Dissolve Take 1 Tablet (4 mg total) by mouth Every 8 hours as needed for Nausea/Vomiting    Perindopril Erbumine (ACEON) 4 mg Oral Tablet Take 1 Tablet (4 mg total) by mouth Once a day    rosuvastatin (CRESTOR) 5 mg Oral Tablet     tamsulosin (FLOMAX) 0.4 mg Oral Capsule Take 1 Capsule (0.4 mg total) by mouth Once a day     Assessment/Plan  Problem List Items Addressed This Visit    None    BPH/LUTS  Discussed the differential diagnosis, pathophysiology and nature of benign prostate enlargement causing lower urinary tract symptoms  Counseled patient on conservative management options including appropriate fluid management, avoidance of diuretics including caffeine and alcohol   Prescription provided for Tamsulosin Hydrochloride (FlomaxT) 0.4 mg P.O. daily:    Prescription I have discussed in great detail with the patient the treatment of prostate enlargement/lower urinary tract symptoms using tamsulosin.  I have explained my rationale for using tamsulosin as well as potential risks of asthenia (2%), dizziness (5%), rhinitis (3%) and abnormal ejaculation (11%). I encouraged patient to report any prior or current history of alpha-1 antagonist use to their opthalmic surgeon before undergoing any eye surgery due to the risk of intraoperative floppy iris syndrome.  The patient expressed an understanding of the treatment, possible reactions, and possible prognosis.  Right Ureterolithiasis, 50m calculus mid ureter with hydronephrosis, 07/03/22  I discussed the differential diagnosis, pathophysiology and nature of urolithiasis and recurrent stone disease.  We also reviewed the recent 2014 American Urological Association guideline on "Medical Management of Kidney Stones" and specifically discussed dietary therapies including:  Increase fluid intake to achieve urine output of at least 2.5 liters daily  Limit sodium intake to no more than 100 mEq (2,300  mg) per day  Consume 1,000-1,200 mg of dietary calcium per day  Limit oxalate-rich foods (beets, spinach, rhubarb, nuts, chocolate)  Limit non-dairy animal protein  Encouraged incresed fruit and vegetable intake   I discussed the differential diagnosis, pathophysiology and nature of urolithiasis and recurrent stone disease.  Patient was counseled on available treatment options in the management of their 664mcalculus located within the right ureter  Conservative, medical expulsive therapy consisting of alpha-blocker therapy, analgesia and directed fluids which is generally reserved for ureteral stones <5 mm without complicating factors (infection, intractable pain/nausea)  The patient was counseled regarding treatment options for urinary calculi including observation, extracorporeal shockwave lithotripsy (ESWL), ureteroscopic extraction/intracorporeal laser lithotripsy, percutaneous nephrolithotomy (PCNL), or open ureterolithotomy/nephrolithotomy. The risks, advantages, and disadvantages of each were discussed.   Patient understands that the risks include but are not limited to bleeding, infection, damage to adjacent tissues, renal fracture/contusion/hemorrhage, anesthesia risks, loss of the kidney, steinstrasse, therapeutic failure, possible need for post-operative drains/stents, need for additional procedures.  After further discussion, patient has elected to undergo continued medical expulsive therapy with continued analgesia, fluid intake, alpha-blocker therapy and close clinical follow-up with intervention if indicated;  patient understands that more emergent intervention will be indicated should they develop fevers/urinary tract infection, intractable pain and/or nausea.    Prostate Cancer Screening  Based on patient's clinical findings including his recent prostate specific antigen level, age, ethnicity and relevant risk factors and in accordance with the American Urological Association (AUL[place]NCCN) published screening guidelines, I would recommend the following:  Continued annual prostate specific antigen & digital rectal exam screening with cessation of screening at age 89r upon developing more serious health issues.  PSA today        Hollis Oh, PA-C

## 2022-07-30 ENCOUNTER — Encounter (INDEPENDENT_AMBULATORY_CARE_PROVIDER_SITE_OTHER): Payer: Self-pay | Admitting: Internal Medicine

## 2022-07-30 LAB — FECAL DNA TESTING (AMB): FECAL DNA TEST (AMB): NEGATIVE

## 2022-07-30 LAB — LAB: COLOGUARD RESULT: NEGATIVE

## 2022-07-30 LAB — COLOGUARD® COLON CANCER SCREEN: COLOGUARD RESULT: NEGATIVE

## 2022-07-31 ENCOUNTER — Encounter (INDEPENDENT_AMBULATORY_CARE_PROVIDER_SITE_OTHER): Payer: Self-pay | Admitting: NURSE PRACTITIONER

## 2022-11-10 ENCOUNTER — Other Ambulatory Visit (INDEPENDENT_AMBULATORY_CARE_PROVIDER_SITE_OTHER): Payer: Self-pay | Admitting: Family

## 2022-11-11 ENCOUNTER — Other Ambulatory Visit (INDEPENDENT_AMBULATORY_CARE_PROVIDER_SITE_OTHER): Payer: Self-pay | Admitting: NURSE PRACTITIONER

## 2022-11-12 ENCOUNTER — Ambulatory Visit (INDEPENDENT_AMBULATORY_CARE_PROVIDER_SITE_OTHER): Payer: Medicare Other | Admitting: OTOLARYNGOLOGY

## 2022-11-12 ENCOUNTER — Other Ambulatory Visit: Payer: Self-pay

## 2022-11-12 ENCOUNTER — Encounter (INDEPENDENT_AMBULATORY_CARE_PROVIDER_SITE_OTHER): Payer: Self-pay | Admitting: OTOLARYNGOLOGY

## 2022-11-12 ENCOUNTER — Encounter (INDEPENDENT_AMBULATORY_CARE_PROVIDER_SITE_OTHER): Payer: Self-pay | Admitting: Internal Medicine

## 2022-11-12 VITALS — Ht 68.0 in | Wt 183.0 lb

## 2022-11-12 DIAGNOSIS — K219 Gastro-esophageal reflux disease without esophagitis: Secondary | ICD-10-CM

## 2022-11-12 DIAGNOSIS — J301 Allergic rhinitis due to pollen: Secondary | ICD-10-CM

## 2022-11-12 DIAGNOSIS — H6123 Impacted cerumen, bilateral: Secondary | ICD-10-CM

## 2022-11-12 DIAGNOSIS — H903 Sensorineural hearing loss, bilateral: Secondary | ICD-10-CM

## 2022-11-12 DIAGNOSIS — H9313 Tinnitus, bilateral: Secondary | ICD-10-CM

## 2022-11-12 NOTE — Procedures (Signed)
[facility], [facility]  [address]  [place] [address]    Procedure Note    Name: Jason Jacobs MRN:  [id number]   Date: 11/12/2022 DOB:  01-11-1946 (77 y.o.)         [phone] - REMOVAL IMPACTED CERUMEN W/ INSTRUMENT, UNILATERAL (AMB ONLY-PD)    Performed by: Lonia Farber, DO  Authorized by: Lonia Farber, DO    Time Out:     Immediately before the procedure, a time out was called:  Yes    Patient verified:  Yes    Procedure Verified:  Yes    Site Verified:  Yes  Procedure: Cerumen cleaning  Pre-op Dx: Cerumen impaction      Bilateral EAC(s) examined under binocular microscopy.  Cerumen and/or debris was cleaned from the canal(s) using curettes, suction, and alligator forceps.  Patient tolerated procedure well.  ENT was present for the entire procedure.     Lonia Farber, DO

## 2022-11-12 NOTE — H&P (Signed)
[facility]  [address]  [place] [address]        Name: Jason Jacobs MRN:  [id number]   Date: 11/12/2022 DOB: April 04, 1946 (77 y.o.)       Referring Provider:  No ref. provider found    Reason for Visit:   Chief Complaint   Patient presents with    Follow Up 6 Months     Skin check   Wears bilateral Hearing Aids.         History of Present Illness:  Jason Jacobs is a 77 y.o. male who is FU on skin lesions/ LPR/ SNHL/ AR. He is doing well. Has no new concerning skin lesions. Taking Prilosec and has occasional heartburn. C/o aural fullness and clogging. Cerumen clogs his HAIDs.       Patient History:  Patient Active Problem List   Diagnosis    Arteriosclerotic cardiovascular disease (ASCVD)    Carotid artery stenosis    Hyperlipidemia    Epileptic seizure (CMS HCC)    Essential hypertension    Esophageal reflux    Anxiety    Dementia (CMS HCC)    Nephrolithiasis    Bilateral hearing loss, unspecified hearing loss type     Current Outpatient Medications   Medication Sig    amLODIPine (NORVASC) 10 mg Oral Tablet Take 1 Tablet (10 mg total) by mouth Once a day Indications: high blood pressure    aspirin (ECOTRIN) 81 mg Oral Tablet, Delayed Release (E.C.) Take 1 Tablet (81 mg total) by mouth    ergocalciferol, vitamin D2, (DRISDOL) 1,250 mcg (50,000 unit) Oral Capsule Take 1 Capsule (50,000 Units total) by mouth Every 14 days Indications: vitamin D deficiency (high dose therapy)    FLUoxetine (PROZAC) 40 mg Oral Capsule Take 1 Capsule (40 mg total) by mouth Once a day Indications: anxiousness associated with depression    Levetiracetam 750 mg Oral Tablet Sustained Release 24 hr TAKE 1 TABLET TWICE A DAY    omeprazole (PRILOSEC) 20 mg Oral Capsule, Delayed Release(E.C.)     ondansetron (ZOFRAN ODT) 4 mg Oral Tablet, Rapid Dissolve Take 1 Tablet (4 mg total) by mouth Every 8 hours as needed for Nausea/Vomiting    Perindopril Erbumine (ACEON) 4 mg Oral Tablet Take 1 Tablet (4 mg total) by mouth Once a  day    rosuvastatin (CRESTOR) 5 mg Oral Tablet     tamsulosin (FLOMAX) 0.4 mg Oral Capsule Take 1 Capsule (0.4 mg total) by mouth Once a day      No Known Allergies  Past Medical History:   Diagnosis Date    Anxiety     Carotid stenosis, left     Chronic obstructive airway disease (CMS HCC)     Coronary artery disease     Dementia (CMS HCC)     Elevated PSA     Epileptic seizure (CMS HCC)     Esophageal reflux     Essential hypertension     Hearing loss     Hyperlipidemia     Meniere's disease     PAD (peripheral artery disease) (CMS HCC)      Past Surgical History:   Procedure Laterality Date    HX CORONARY ARTERY BYPASS GRAFT       Family Medical History:       Problem Relation (Age of Onset)    Cancer Other    Elevated Lipids Mother    Emphysema Father    Heart Attack Father    Migraines Other  Sleep apnea Other            Social History     Tobacco Use    Smoking status: Never    Smokeless tobacco: Never   Substance Use Topics    Alcohol use: Never    Drug use: Never       Review of Systems:  Review of Systems    Physical Exam:  Ht 1.727 m (5\' 8" )   Wt 83 kg (183 lb)   BMI 27.83 kg/m       Physical Exam  Constitutional:       Appearance: Normal appearance. He is well-developed, well-groomed and normal weight.   HENT:      Head: Normocephalic and atraumatic.      Right Ear: Hearing, tympanic membrane, ear canal and external ear normal. There is impacted cerumen.      Left Ear: Hearing, tympanic membrane, ear canal and external ear normal. There is impacted cerumen.      Nose: Septal deviation and mucosal edema present.      Right Turbinates: Enlarged.      Left Turbinates: Enlarged.      Mouth/Throat:      Lips: Pink.      Mouth: Mucous membranes are moist.      Pharynx: Oropharynx is clear. Uvula midline.   Eyes:      Extraocular Movements: Extraocular movements intact.   Neck:      Trachea: Phonation normal.   Pulmonary:      Effort: Pulmonary effort is normal.   Musculoskeletal:      Cervical back: Normal  range of motion and neck supple.   Lymphadenopathy:      Cervical: No cervical adenopathy.   Skin:     General: Skin is warm.   Neurological:      Mental Status: He is alert and oriented to person, place, and time.      Cranial Nerves: Cranial nerves 2-12 are intact. No facial asymmetry.   Psychiatric:         Attention and Perception: Attention normal.         Mood and Affect: Mood normal.         Speech: Speech normal.         Behavior: Behavior normal. Behavior is cooperative.          Assessment:  ENCOUNTER DIAGNOSES     ICD-10-CM   1. 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 Gastroesophageal reflux disease without esophagitis  K21.9   5. Bilateral impacted cerumen  H61.23   6. Laryngopharyngeal reflux (LPR)  K21.9       Plan:  Medical records reviewed on 11/12/2022.  AU cerumen debrided.   Continue hearing aid use  Nasal saline b.i.d.  Reflux precautions/diet modifications  Continue Prilosec daily  Orders Placed This Encounter    [phone] - REMOVAL IMPACTED CERUMEN W/ INSTRUMENT, UNILATERAL (AMB ONLY-PD)    31575 - LARYNGOSCOPY, FLEXIBLE DIAGNOSTIC (AMB ONLY)     Follow-up in 1 year sooner PRN    The advanced practice clinician's documentation was reviewed/amended in its entirety with the assessment and plan portion completely performed independently by me during this encounter.   Lonia Farber, D.O., MMS  Marcelline Deist, PA-C    ENT / Facial Plastic Surgery    I appreciate the opportunity to be involved in the care  of your patients.  If you have any questions or concerns regarding this encounter, please do not hesitate to contact me at your convenience.        This note may have been partially generated using MModal Fluency Direct system, and there may be some incorrect words, spellings, and punctuation that were not noted in checking the note before saving, though effort was made to avoid such  errors.

## 2022-11-12 NOTE — Procedures (Signed)
[facility]  [address]  [place] [address]    Procedure Note    Name: Jason Jacobs MRN:  [id number]   Date: 11/12/2022 DOB:  1946/01/20 (77 y.o.)         31575 - LARYNGOSCOPY, FLEXIBLE DIAGNOSTIC (AMB ONLY)    Performed by: Lonia Farber, DO  Authorized by: Lonia Farber, DO    Time Out:     Immediately before the procedure, a time out was called:  Yes    Patient verified:  Yes    Procedure Verified:  Yes    Site Verified:  Yes  Indications for procedure: Unable to fully visualize laryngeal anatomy on indirect laryngoscopy and GERD / LPR management    Anesthesia:  Topical lidocaine/ phenylephrine     Description: The flexible endoscope was gently introduced into the nostril and passed along the floor of the nose to the nasopharynx. Adenoid pad and Eustachian tube orifices. The retropalatal airway was patent.    The endoscope was passed to the oropharynx.  Base of tongue/lingual tonsils without masses or lesions, patent valelulla, and sharply defined upright epiglottis. Retrolingual airway was patent.    The larynx displayed normal true vocal cords with good mobility. False cords were normal. Arytenoid mucosa was mildly erythematous and edematous.    The piriform recesses were symmetric without secretion. The hypopharynx was symmetric without lesion.    Findings: Laryngopharyngeal Reflux    The patient tolerated the procedure well    This note may have been partially generated using MModal Fluency Direct system, and there may be some incorrect words, spellings, and punctuation that were not noted in checking the note before saving, though effort was made to avoid such errors.      Lonia Farber, DO

## 2022-11-13 ENCOUNTER — Other Ambulatory Visit (INDEPENDENT_AMBULATORY_CARE_PROVIDER_SITE_OTHER): Payer: Self-pay | Admitting: NURSE PRACTITIONER

## 2022-11-13 MED ORDER — PERINDOPRIL ERBUMINE 4 MG TABLET
4.0000 mg | ORAL_TABLET | Freq: Every day | ORAL | 1 refills | Status: DC
Start: 2022-11-13 — End: 2023-03-03

## 2022-11-13 NOTE — Telephone Encounter (Signed)
From: Tamala Fothergill  To: Magdalene Patricia  Sent: 11/12/2022 2:26 PM EDT  Subject: Refill of Perindopril    Please authorize refill of Kushal's Perindopril Erbumine 4mg . to [facility]. Thanks!

## 2022-12-03 ENCOUNTER — Encounter (INDEPENDENT_AMBULATORY_CARE_PROVIDER_SITE_OTHER): Payer: Self-pay | Admitting: Internal Medicine

## 2022-12-08 ENCOUNTER — Telehealth (INDEPENDENT_AMBULATORY_CARE_PROVIDER_SITE_OTHER): Payer: Self-pay | Admitting: Internal Medicine

## 2022-12-09 ENCOUNTER — Encounter (INDEPENDENT_AMBULATORY_CARE_PROVIDER_SITE_OTHER): Payer: Self-pay | Admitting: NEUROLOGY

## 2022-12-18 ENCOUNTER — Ambulatory Visit (INDEPENDENT_AMBULATORY_CARE_PROVIDER_SITE_OTHER): Payer: Medicare Other | Admitting: NEUROLOGY

## 2022-12-18 NOTE — Progress Notes (Deleted)
ASSESSMENT  1.  2.    PLAN  1.  2.    Thank you for allowing me to participate in your patient's care and please do not hesitate to contact me for any questions or concerns.    Patrick North, DO  Assistant Professor of Neurology  [facility]       ==========================================================================================================================================    NAME:  Jason Jacobs  DOB:  08-26-1945  VISIT DATE:  12/18/2022    CC:  ***    Patient seen in consultation at the request of Dr. Marland Kitchen  History obtained from the patient and chart/records  Age of patient:  77 y.o.      HPI:   I had the pleasure of seeing your patient in neurology clinic for an outpatient consultation, who is a 77 y.o. year old male who was referred for evaluation of ***.  Please allow me to summarize the history for the record.  ***    ============================================================================================================================================  PMHx  Patient Active Problem List   Diagnosis    Arteriosclerotic cardiovascular disease (ASCVD)    Carotid artery stenosis    Hyperlipidemia    Epileptic seizure (CMS HCC)    Essential hypertension    Esophageal reflux    Anxiety    Dementia (CMS HCC)    Nephrolithiasis    Bilateral hearing loss, unspecified hearing loss type     Past Surgical History:   Procedure Laterality Date    HX CORONARY ARTERY BYPASS GRAFT           Family Medical History:       Problem Relation (Age of Onset)    Cancer Other    Elevated Lipids Mother    Emphysema Father    Heart Attack Father    Migraines Other    Sleep apnea Other            Current Outpatient Medications   Medication Sig Dispense Refill    amLODIPine (NORVASC) 10 mg Oral Tablet Take 1 Tablet (10 mg total) by mouth Once a day Indications: high blood pressure 90 Tablet 3    aspirin (ECOTRIN) 81 mg Oral Tablet, Delayed Release (E.C.) Take 1 Tablet (81 mg total) by  mouth      ergocalciferol, vitamin D2, (DRISDOL) 1,250 mcg (50,000 unit) Oral Capsule Take 1 Capsule (50,000 Units total) by mouth Every 14 days Indications: vitamin D deficiency (high dose therapy) 6 Capsule 3    FLUoxetine (PROZAC) 40 mg Oral Capsule Take 1 Capsule (40 mg total) by mouth Once a day Indications: anxiousness associated with depression 90 Capsule 3    Levetiracetam 750 mg Oral Tablet Sustained Release 24 hr TAKE 1 TABLET TWICE A DAY 180 Tablet 1    omeprazole (PRILOSEC) 20 mg Oral Capsule, Delayed Release(E.C.)       ondansetron (ZOFRAN ODT) 4 mg Oral Tablet, Rapid Dissolve Take 1 Tablet (4 mg total) by mouth Every 8 hours as needed for Nausea/Vomiting 12 Tablet 0    Perindopril Erbumine (ACEON) 4 mg Oral Tablet Take 1 Tablet (4 mg total) by mouth Once a day 90 Tablet 1    rosuvastatin (CRESTOR) 5 mg Oral Tablet       tamsulosin (FLOMAX) 0.4 mg Oral Capsule Take 1 Capsule (0.4 mg total) by mouth Once a day 30 Capsule 11     No current facility-administered medications for this visit.     No Known Allergies  Social History     Socioeconomic History  Marital status: Married     Spouse name: Not on file    Number of children: Not on file    Years of education: Not on file    Highest education level: Not on file   Occupational History    Not on file   Tobacco Use    Smoking status: Never    Smokeless tobacco: Never   Substance and Sexual Activity    Alcohol use: Never    Drug use: Never    Sexual activity: Not on file   Other Topics Concern    Not on file   Social History Narrative    Not on file     Social Determinants of Health     Financial Resource Strain: Not on file   Transportation Needs: Not on file   Social Connections: Not on file   Intimate Partner Violence: Not on file   Housing Stability: Not on file       ============================================================================================================================================  GENERAL EXAMINATION  There were no vitals  taken for this visit.      Vital signs personally reviewed  General: No acute distress, alert  HEENT: Normocephalic, no scleral icterus  Pulmonary: No accessory muscle use, no tachypnea  Cardiovascular: Heart with regular rate & rhythm  Extremities: No significant edema, No cyanosis    NEUROLOGIC EXAM  On neurological exam, patient was awake, alert and answering questions appropriately  Speech was fluent, without dysarthria or aphasia.    CN  II: not directly tested, grossly intact  III, IV, VI: extraocular movements intact without nystagmus  V: intact to light touch  VII: face symmetric without weakness  VIII: grossly intact  IX, X: symmetric palatal elevation  XI: normal strength of trapezius and sternocleidomastoid bilaterally  XII: tongue midline with full movements    MOTOR  Bulk: normal  Tone: normal  Abnormal Movements: none  Fasciculations: none    Strength: Formal strength testing was deferred. Patient moving all extremities symmetrically and against gravity***    MRC Grading Scale   Right Left   Deltoid *** ***   Biceps *** ***   Triceps *** ***   Wrist Extension *** ***   Wrist Flexion - -   Finger Extension *** ***   Finger Abduction *** ***   Finger Flexion *** ***   Hip Flexion *** ***   Hip Extension - -   Hip Abduction - -   Hip Adduction - -   Knee Extension *** ***   Knee Flexion *** ***   Ankle Dorsiflexion *** ***   Ankle Plantarflexion *** ***   Toe Extension *** ***   Toe Flexion - -     REFLEXES   Right Left   Biceps *** ***   Triceps *** ***   Brachioradialis *** ***   Patellar *** ***   Achilles *** ***   Plantar *** ***   Hoffman *** ***   Pectoralis - -   Jaw Jerk - -       SENSORY  Light touch: ***  Pin: ***  Vibration: ***  Proprioception: ***    GAIT  General: casual, normal gait  Toe Walk: normal  Heel Walk: normal  Tandem: normal    COORDINATION  Finger nose finger: normal  Heel to shin:  normal    ================================================================================================================================LABS  Personal Review of prior labs is notable for:   2024  CBC largely WNL   CMP with mildly elevated creatinine GFR 54  B12 244  TSH  WNL   IMAGING  Personal Review of imaging is notable for: no pertinent imaging for review  OTHER DIAGNOSTICS  Personal Review of other prior diagnostics is notable for: not applicable

## 2022-12-18 NOTE — Telephone Encounter (Signed)
Patient was scheduled to come see Dr. Sedalia Muta on July 25th 2024. Wife called on the day of appt to cancel and said they won't be coming to the appt because they have a family emergency and will need to reschedule. Patients appt is now in May 2025.

## 2023-01-12 ENCOUNTER — Other Ambulatory Visit: Payer: Self-pay

## 2023-01-12 ENCOUNTER — Ambulatory Visit (INDEPENDENT_AMBULATORY_CARE_PROVIDER_SITE_OTHER): Payer: Medicare Other | Admitting: Physician Assistant

## 2023-01-12 VITALS — BP 113/53 | HR 64 | Ht 68.0 in | Wt 181.0 lb

## 2023-01-12 DIAGNOSIS — Z87442 Personal history of urinary calculi: Secondary | ICD-10-CM

## 2023-01-12 DIAGNOSIS — N400 Enlarged prostate without lower urinary tract symptoms: Secondary | ICD-10-CM

## 2023-01-12 MED ORDER — TAMSULOSIN 0.4 MG CAPSULE
0.4000 mg | ORAL_CAPSULE | Freq: Every day | ORAL | 3 refills | Status: DC
Start: 2023-01-12 — End: 2023-10-29

## 2023-01-12 NOTE — Progress Notes (Signed)
[facility]  UROLOGY, [facility]    Progress Note    Name: Johanna Bolyard MRN:  [id number]   Date: 01/12/2023 Age: 77 y.o.       Chief Complaint: Follow Up 6 Months (Pt f/u on BPH w/o obstruction )    Subjective:   Patient is a 77 yo male whom presents to the clinic for ureterolithiasis  on CT Abdomen/Pelvis 07/03/22. Patient denies any pain since last visit. Patient started taking Flomax at last visit and report strong urine stream without dribbling or hesitancy..History of renal calculus with lithotripsy. Patient denies history of tobacco use. Patient denies personal or family history of urinary malignancy. Patient denies occupational chemical exposure.Patient denies fevers, chills, nausea, vomiting, hematuria, dysuria, flank pain, incontinence, dribbling, hesitancy, suprapubic pain, headaches, vision changes, shortness of breath, chest pain.      BP (!) 113/53 (Site: Right Arm, Patient Position: Sitting, Cuff Size: Adult)   Pulse 64   Ht 1.727 m (5\' 8" )   Wt 82.1 kg (181 lb)   BMI 27.52 kg/m       Gen: NAD, alert  Pulm: unlabored at rest  CV: palpable pulses  Abd: soft, Nt/ND  GU: no suprapubic tenderness, no CVAT   Data reviewed:  PROSTATE SPECIFIC ANTIGEN   Lab Results   Component Value Date/Time    PROSSPECAG 0.59 07/14/2022 04:07 PM             Current Outpatient Medications   Medication Sig    amLODIPine (NORVASC) 10 mg Oral Tablet Take 1 Tablet (10 mg total) by mouth Once a day Indications: high blood pressure    aspirin (ECOTRIN) 81 mg Oral Tablet, Delayed Release (E.C.) Take 1 Tablet (81 mg total) by mouth    ergocalciferol, vitamin D2, (DRISDOL) 1,250 mcg (50,000 unit) Oral Capsule Take 1 Capsule (50,000 Units total) by mouth Every 14 days Indications: vitamin D deficiency (high dose therapy)    FLUoxetine (PROZAC) 40 mg Oral Capsule Take 1 Capsule (40 mg total) by mouth Once a day Indications: anxiousness associated with depression    Levetiracetam 750 mg Oral Tablet Sustained Release 24  hr TAKE 1 TABLET TWICE A DAY    omeprazole (PRILOSEC) 20 mg Oral Capsule, Delayed Release(E.C.)     ondansetron (ZOFRAN ODT) 4 mg Oral Tablet, Rapid Dissolve Take 1 Tablet (4 mg total) by mouth Every 8 hours as needed for Nausea/Vomiting    Perindopril Erbumine (ACEON) 4 mg Oral Tablet Take 1 Tablet (4 mg total) by mouth Once a day    rosuvastatin (CRESTOR) 5 mg Oral Tablet     tamsulosin (FLOMAX) 0.4 mg Oral Capsule Take 1 Capsule (0.4 mg total) by mouth Once a day     Assessment/Plan  Problem List Items Addressed This Visit    None  Visit Diagnoses       BPH without obstruction/lower urinary tract symptoms    -  Primary    Relevant Orders    POCT URINE DIPSTICK          BPH/LUTS  Discussed the differential diagnosis, pathophysiology and nature of benign prostate enlargement causing lower urinary tract symptoms  Counseled patient on conservative management options including appropriate fluid management, avoidance of diuretics including caffeine and alcohol   Refill provided for Tamsulosin Hydrochloride (FlomaxT) 0.4 mg P.O. daily:    Prescription I have discussed in great detail with the patient the treatment of prostate enlargement/lower urinary tract symptoms using tamsulosin.  I have explained my rationale for using  tamsulosin as well as potential risks of asthenia (2%), dizziness (5%), rhinitis (3%) and abnormal ejaculation (11%). I encouraged patient to report any prior or current history of alpha-1 antagonist use to their opthalmic surgeon before undergoing any eye surgery due to the risk of intraoperative floppy iris syndrome.  The patient expressed an understanding of the treatment, possible reactions, and possible prognosis.         History of Nephrolithiasis  I discussed the differential diagnosis, pathophysiology and nature of asymptomatic non-obstructing renal stones as well as the natural history  Based on the current best-available data [J Urol. 2015 Apr;193(4):1265-9] with an average follow-up of 3.5  years:  Approximately 60% of patients will remain asymptomatic  Less than 30% will experience renal colic  Less than 20% will require operative intervention for pain  Spontaneous stone passage will occur in 7% of cases  Importantly, patient was counseled on risk of silent obstruction causing hydronephrosis and requiring intervention    Patient was counseled on the need for possible future treatment for urinary calculi including observation, extracorporeal shockwave lithotripsy (ESWL), ureteroscopic extraction/intracorporeal laser lithotripsy, percutaneous nephrolithotomy (PCNL), or open ureterolithotomy/nephrolithotomy. The risks, advantages, and disadvantages of each were discussed.   I reviewed the recent 2014 American Urological Association guideline on "Medical Management of Kidney Stones" and specifically discussed dietary therapies including:  Increase fluid intake to achieve urine output of at least 2.5 liters daily  Limit sodium intake to no more than 100 mEq (2,300 mg) per day  Consume 1,000-1,200 mg of dietary calcium per day  Limit oxalate-rich foods (beets, spinach, rhubarb, nuts, chocolate)  Limit non-dairy animal protein  Encouraged increased fruit and vegetable intake  Patient was provided with written stone prevention recommendations.  Patient was also counseled on the benefits of further serum and 24 hour urine testing to identify the metabolic disorders responsible for recurrent stone disease     Prostate Cancer Screening  Based on patient's clinical findings including his recent prostate specific antigen level, age, ethnicity and relevant risk factors and in accordance with the American Urological Association (AUA) & National Comprehensive Cancer Network (NCCN) published screening guidelines, I would recommend the following:  Continued annual prostate specific antigen & digital rectal exam screening with cessation of screening at age 41 or upon developing more serious health issues.  PSA  yearly        Gwenith Tschida, PA-C

## 2023-02-02 ENCOUNTER — Other Ambulatory Visit (INDEPENDENT_AMBULATORY_CARE_PROVIDER_SITE_OTHER): Payer: Self-pay | Admitting: NURSE PRACTITIONER

## 2023-02-12 ENCOUNTER — Encounter (INDEPENDENT_AMBULATORY_CARE_PROVIDER_SITE_OTHER): Payer: Self-pay | Admitting: Internal Medicine

## 2023-02-12 MED ORDER — ROSUVASTATIN 5 MG TABLET
5.0000 mg | ORAL_TABLET | Freq: Every day | ORAL | 0 refills | Status: DC
Start: 2023-02-12 — End: 2023-03-03

## 2023-02-23 ENCOUNTER — Other Ambulatory Visit (INDEPENDENT_AMBULATORY_CARE_PROVIDER_SITE_OTHER): Payer: Self-pay | Admitting: NURSE PRACTITIONER

## 2023-02-26 ENCOUNTER — Other Ambulatory Visit (INDEPENDENT_AMBULATORY_CARE_PROVIDER_SITE_OTHER): Payer: Self-pay | Admitting: Internal Medicine

## 2023-02-26 NOTE — Telephone Encounter (Signed)
Looks like he needs a visit or he can get it from a neurologist if he sees one.

## 2023-02-27 ENCOUNTER — Other Ambulatory Visit (INDEPENDENT_AMBULATORY_CARE_PROVIDER_SITE_OTHER): Payer: Self-pay | Admitting: Internal Medicine

## 2023-02-27 NOTE — Telephone Encounter (Signed)
Pt has no appt w/ me.  He did not keep appt w/ neuro.  He has also asked [facility] for refills.  He needs to be complaint with his medical care.  Appt needed.

## 2023-03-03 ENCOUNTER — Ambulatory Visit (INDEPENDENT_AMBULATORY_CARE_PROVIDER_SITE_OTHER): Payer: Medicare Other | Admitting: Internal Medicine

## 2023-03-03 ENCOUNTER — Encounter (INDEPENDENT_AMBULATORY_CARE_PROVIDER_SITE_OTHER): Payer: Self-pay | Admitting: Internal Medicine

## 2023-03-03 ENCOUNTER — Other Ambulatory Visit: Payer: Self-pay

## 2023-03-03 VITALS — BP 136/70 | HR 64 | Temp 98.1°F | Resp 18 | Ht 68.0 in | Wt 184.0 lb

## 2023-03-03 DIAGNOSIS — Z23 Encounter for immunization: Secondary | ICD-10-CM

## 2023-03-03 DIAGNOSIS — I10 Essential (primary) hypertension: Secondary | ICD-10-CM

## 2023-03-03 DIAGNOSIS — F0390 Unspecified dementia without behavioral disturbance: Secondary | ICD-10-CM

## 2023-03-03 DIAGNOSIS — E785 Hyperlipidemia, unspecified: Secondary | ICD-10-CM

## 2023-03-03 DIAGNOSIS — K219 Gastro-esophageal reflux disease without esophagitis: Secondary | ICD-10-CM

## 2023-03-03 DIAGNOSIS — G40909 Epilepsy, unspecified, not intractable, without status epilepticus: Secondary | ICD-10-CM

## 2023-03-03 MED ORDER — CYANOCOBALAMIN (VIT B-12) 1,000 MCG TABLET
1000.0000 ug | ORAL_TABLET | Freq: Every day | ORAL | Status: AC
Start: 2023-03-03 — End: ?

## 2023-03-03 MED ORDER — ROSUVASTATIN 5 MG TABLET
5.0000 mg | ORAL_TABLET | Freq: Every day | ORAL | 3 refills | Status: DC
Start: 2023-03-03 — End: 2024-01-11

## 2023-03-03 MED ORDER — ROSUVASTATIN 5 MG TABLET
5.0000 mg | ORAL_TABLET | Freq: Every day | ORAL | 0 refills | Status: DC
Start: 2023-03-03 — End: 2023-03-03

## 2023-03-03 MED ORDER — PERINDOPRIL ERBUMINE 4 MG TABLET
4.0000 mg | ORAL_TABLET | Freq: Every day | ORAL | 1 refills | Status: DC
Start: 2023-03-03 — End: 2023-03-03

## 2023-03-03 MED ORDER — PERINDOPRIL ERBUMINE 4 MG TABLET
4.0000 mg | ORAL_TABLET | Freq: Every day | ORAL | 3 refills | Status: DC
Start: 2023-03-03 — End: 2024-01-29

## 2023-03-03 MED ORDER — OMEPRAZOLE 20 MG CAPSULE,DELAYED RELEASE
20.0000 mg | DELAYED_RELEASE_CAPSULE | Freq: Every day | ORAL | 3 refills | Status: DC
Start: 2023-03-03 — End: 2023-03-03

## 2023-03-03 MED ORDER — LEVETIRACETAM ER 750 MG TABLET,EXTENDED RELEASE 24 HR
1.0000 | ORAL_TABLET | Freq: Two times a day (BID) | ORAL | 3 refills | Status: DC
Start: 2023-03-03 — End: 2023-05-25

## 2023-03-03 MED ORDER — OMEPRAZOLE 20 MG CAPSULE,DELAYED RELEASE
20.0000 mg | DELAYED_RELEASE_CAPSULE | Freq: Every day | ORAL | 3 refills | Status: DC
Start: 2023-03-03 — End: 2023-12-22

## 2023-03-03 NOTE — Assessment & Plan Note (Addendum)
LDL 38  Orders:    rosuvastatin (CRESTOR) 5 mg Oral Tablet; Take 1 Tablet (5 mg total) by mouth Once a day

## 2023-03-03 NOTE — Assessment & Plan Note (Addendum)
Orders:    Levetiracetam 750 mg Oral Tablet Sustained Release 24 hr; Take 1 Tablet (750 mg total) by mouth Twice daily

## 2023-03-03 NOTE — Assessment & Plan Note (Addendum)
Last B12 level 02/24 was 240.  Advised wife to start B12 daily.  He seems much more worried today about having to take meds and generally anxious about everything we talk about.    I cannot find any brain imaging.  Wife has records at home from Dr. Marta Antu.  Last visit 2 yrs ago and she will ck to see when he last had brain imaging. Ct would probably be preferable as I am not sure pt will lie still for MRI.   Orders:    cyanocobalamin (VITAMIN B-12) 1,000 mcg Oral Tablet; Take 1 Tablet (1,000 mcg total) by mouth Once a day

## 2023-03-03 NOTE — Assessment & Plan Note (Addendum)
BP controlled  Orders:    Perindopril Erbumine (ACEON) 4 mg Oral Tablet; Take 1 Tablet (4 mg total) by mouth Once a day

## 2023-03-03 NOTE — Nursing Note (Signed)
03/03/23 1113   Recent Weight Change   Have you had a recent unexplained weight loss or gain? N   Health Education and Literacy   How often do you have a problem understanding what is told to you about your medical condition?  Never   Domestic Violence   Because we are aware of abuse and domestic violence today, we ask all patients: Are you being hurt, hit, or frightened by anyone at your home or in your life?  N   Basic Needs   Do you have any basic needs within your home that are not being met? (such as Food, Shelter, Civil [employer] fast streamer, Tranportation, paying for bills and/or medications) N   Advanced Directives   Do you have any advanced directives? No Advance   Would you like an advanced directive packet? Accepted Packet

## 2023-03-03 NOTE — Nursing Note (Signed)
03/03/23 1113   Depression Screen   Little interest or pleasure in doing things. 0   Feeling down, depressed, or hopeless 0   PHQ 2 Total 0

## 2023-03-03 NOTE — Assessment & Plan Note (Addendum)
Orders:    omeprazole (PRILOSEC) 20 mg Oral Capsule, Delayed Release(E.C.); Take 1 Capsule (20 mg total) by mouth Once a day

## 2023-03-03 NOTE — Progress Notes (Signed)
F[facility]ILY [facility]ICINE, [facility] OFF[facility]  [address]  [place] [address]      Jason Jacobs  [birth year]  [id number]    Date of Service: 03/03/2023 11:15 AM EDT    Chief complaint:   Chief Complaint   Patient presents with    Follow Up    Medication Refill       Subjective:     This is a case of a 77 y.o. year old male who comes in today for CDM.    Pt has appt w/ Dr. Sedalia Muta next May.     Per last visit with Vascular surgeon:    Duplex performed today notable for right ICA with 50-69% stenosis and left ICA with No hemodynamically significant stenosis.     Pt to follow-up in 6 month for repeat carotid duplex.     He is taking Flomax and is urinating okay.      Per last visit:  Recent lab results not available. He has taken vit D long term.     He has had memory issues since around 2014.  He enjoys word puzzles.  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 He worked for [employer] for 40 years.   He was previously followed by Dr. Marta Antu for neurology but he retired.  He was diagnosed with partial complex seizures in 2014.  Three is no hx listed of stroke and no brain imaging available to me.      He has not taken any meds for his cognitive impairment.  He does not particularly want medication but his wife questions me.  He appears to take care of his ADL but has significant short term memory issues.       He follows with his dermatologist   Vascular surgery f/u w/ Dr. Verl Bangs- 10/23:  Duplex  R ICA 50-69% velocities of 247/41. Left ICA with no significant restenosis following CEA.       Recent lab results reviewed and noted.  COMPLETE BLOOD COUNT   Lab Results   Component Value Date    WBC 12.8 (H) 07/03/2022    HGB 14.7 07/03/2022    HCT 43.2 07/03/2022    PLTCNT 213 07/03/2022       DIFFERENTIAL  Lab Results   Component Value Date    PMNS 74 07/03/2022    LYMPHOCYTES 14 (L) 07/03/2022    MONOCYTES 11 07/03/2022    EOSINOPHIL 1 07/03/2022    BASOPHILS 1 07/03/2022    BASOPHILS 0.10 07/03/2022    PMNABS  9.50 (H) 07/03/2022    LYMPHSABS 1.80 07/03/2022    EOSABS 0.10 07/03/2022    MONOSABS 1.30 (H) 07/03/2022     COMPREHENSIVE METABOLIC PANEL FASTING  Lab Results   Component Value Date    SODIUM 138 07/03/2022    POTASSIUM 4.3 07/03/2022    CHLORIDE 107 07/03/2022    CO2 23 07/03/2022    ANIONGAP 8 07/03/2022    BUN 28 (H) 07/03/2022    CREATININE 1.35 (H) 07/03/2022    GLUCOSE Negative 07/03/2022    CALCIUM 9.3 07/03/2022    ALBUMIN 4.3 07/03/2022    TOTALPROTEIN 6.9 07/03/2022    ALKPHOS 71 07/03/2022    AST 31 07/03/2022    ALT 34 07/03/2022     THYROID STIMULATING HORMONE  Lab Results   Component Value Date    TSH 2.506 07/03/2022     VITAMIN B12  Lab Results   Component Value Date    VITB12 244  07/03/2022     VITAMIN D 25-HYDROXY  Lab Results   Component Value Date    VITD 66 07/03/2022       ROS:  Constitutional - no appetite or weight changes, no fatigue, no fevers, chills, or night sweats  Respiratory - no dyspnea or cough  Cardiovascular - no chest pain or palpitations  Gastrointestinal - no nausea, vomiting, diarrhea, or constipation, no dyspepsia  Endocrine - no heat or cold intolerance, no polyuria, polydipsia, or polyphagia  Skin - no rashes, color changes, or lesions  Musculoskeletal - no arthralgias or myalgias  Genitourinary - no urinary frequency or dysuria, no genital discharge  Neurologic - no vision or hearing changes, no weakness, no parasthesias   Psychiatric - mood has been appropriate, no depression or anxiety    Active Ambulatory Problems     Diagnosis Date Noted    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    Carotid artery stenosis 05/31/2007    Hyperlipidemia 05/31/2007    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    Essential hypertension 01/30/2022    Esophageal reflux 01/30/2022    Anxiety 01/30/2022    Dementia (CMS HCC) 01/30/2022    Nephrolithiasis 07/03/2022    Bilateral hearing loss, unspecified hearing loss type 07/03/2022     Resolved Ambulatory Problems      Diagnosis Date Noted    Carotid stenosis, left 09/27/2015     Past Medical History:   Diagnosis Date    Chronic obstructive airway disease (CMS HCC)     Coronary artery disease     Elevated PSA     Hearing loss     Meniere's disease     PAD (peripheral artery disease) (CMS HCC)        Past Surgical History:   Procedure Laterality Date    HX CORONARY ARTERY BYPASS GRAFT             Current Outpatient Medications   Medication Sig    amLODIPine (NORVASC) 10 mg Oral Tablet Take 1 Tablet (10 mg total) by mouth Once a day Indications: high blood pressure    aspirin (ECOTRIN) 81 mg Oral Tablet, Delayed Release (E.C.) Take 1 Tablet (81 mg total) by mouth    ergocalciferol, vitamin D2, (DRISDOL) 1,250 mcg (50,000 unit) Oral Capsule Take 1 Capsule (50,000 Units total) by mouth Every 14 days Indications: vitamin D deficiency (high dose therapy)    FLUoxetine (PROZAC) 40 mg Oral Capsule Take 1 Capsule (40 mg total) by mouth Once a day Indications: anxiousness associated with depression    Levetiracetam 750 mg Oral Tablet Sustained Release 24 hr Take 1 Tablet (750 mg total) by mouth Twice daily    omeprazole (PRILOSEC) 20 mg Oral Capsule, Delayed Release(E.C.) Take 1 Capsule (20 mg total) by mouth Once a day    ondansetron (ZOFRAN ODT) 4 mg Oral Tablet, Rapid Dissolve Take 1 Tablet (4 mg total) by mouth Every 8 hours as needed for Nausea/Vomiting    Perindopril Erbumine (ACEON) 4 mg Oral Tablet Take 1 Tablet (4 mg total) by mouth Once a day    rosuvastatin (CRESTOR) 5 mg Oral Tablet Take 1 Tablet (5 mg total) by mouth Once a day    tamsulosin (FLOMAX) 0.4 mg Oral Capsule Take 1 Capsule (0.4 mg total) by mouth Once a day       Objective:     BP 136/70   Pulse 64   Temp 36.7 C (98.1 F)   Resp 18   Ht 1.727 m (  5\' 8" )   Wt 83.5 kg (184 lb)   SpO2 96%   BMI 27.98 kg/m       BMI addressed: Intervention for BMI inappropriate due to age over 30 and comorbidities.     General appearance: alert, cooperative, in no acute  distress  HEENT: PERRL; Ears: NL TM,  Throat: non-erythematous w/o lesion, moist mucous membranes; no LAD or thyromegaly, neck supple.  Lungs: clear to auscultation bilaterally; no wheezes or rhonchi   Heart: regular rate and rhythm; normal S1 & S2; no murmur  Abdomen: soft, non-tender, not distended; bowel sounds present.  No palpable masses.  Extremities: extremities normal ROM, no cyanosis or edema , no rash  Vascular: carotid, femoral, pedel pulses are normal w/o bruit  Psych: alert and oriented x 3  Neuro: CN 2-12 grossly intact  Peripheral motor and sensory exams are grossly normal    Assessment/Plan     Assessment & Plan  Dementia, unspecified dementia severity, unspecified dementia type, unspecified whether behavioral, psychotic, or mood disturbance or anxiety (CMS HCC)  Last B12 level 02/24 was 240.  Advised wife to start B12 daily.  He seems much more worried today about having to take meds and generally anxious about everything we talk about.    I cannot find any brain imaging.  Wife has records at home from Dr. Marta Antu.  Last visit 2 yrs ago and she will ck to see when he last had brain imaging. Ct would probably be preferable as I am not sure pt will lie still for MRI.   Orders:    cyanocobalamin (VITAMIN B-12) 1,000 mcg Oral Tablet; Take 1 Tablet (1,000 mcg total) by mouth Once a day    Epileptic seizure (CMS HCC)    Orders:    Levetiracetam 750 mg Oral Tablet Sustained Release 24 hr; Take 1 Tablet (750 mg total) by mouth Twice daily    Essential hypertension  BP controlled  Orders:    Perindopril Erbumine (ACEON) 4 mg Oral Tablet; Take 1 Tablet (4 mg total) by mouth Once a day    Hyperlipidemia  LDL 38  Orders:    rosuvastatin (CRESTOR) 5 mg Oral Tablet; Take 1 Tablet (5 mg total) by mouth Once a day    Gastroesophageal reflux disease, unspecified whether esophagitis present    Orders:    omeprazole (PRILOSEC) 20 mg Oral Capsule, Delayed Release(E.C.); Take 1 Capsule (20 mg total) by mouth Once  a day    Flu vaccine need    Orders:    Flu Vaccine, 65+,0.5 mL IM (Admin)               Health Maintenance:   Depression screening is negative. PHQ 2 Total: 0       Suggested that they have brain imaging prior to visit w/ Dr. Sedalia Muta.             The patient was given the opportunity to ask questions and those questions were answered to the patient's satisfaction. The patient was encouraged to call with any additional questions or concerns. Instructed patient to call back if symptoms worse.   Discussed with patient effects and side effects of medications. Medication safety was discussed. A copy of the patient's medication list was printed and given to the patient. A good faith effort was made to reconcile the patient's medications.       Follow up: Return in about 6 months (around 09/01/2023).    Magdalene Patricia, MD

## 2023-03-04 ENCOUNTER — Encounter (INDEPENDENT_AMBULATORY_CARE_PROVIDER_SITE_OTHER): Payer: Self-pay | Admitting: Internal Medicine

## 2023-03-05 ENCOUNTER — Other Ambulatory Visit (INDEPENDENT_AMBULATORY_CARE_PROVIDER_SITE_OTHER): Payer: Self-pay | Admitting: Internal Medicine

## 2023-03-05 DIAGNOSIS — F0390 Unspecified dementia without behavioral disturbance: Secondary | ICD-10-CM

## 2023-03-17 ENCOUNTER — Encounter (INDEPENDENT_AMBULATORY_CARE_PROVIDER_SITE_OTHER): Payer: Self-pay | Admitting: Internal Medicine

## 2023-03-17 IMAGING — MR MRI BRAIN W/O CONTRAST
11 series · 48 of 48 positions shown · non-contrast
Comparison: None available.

﻿EXAM:  MRI BRAIN W/O CONTRAST
INDICATION: 77-year-old male with symptoms of dementia. Headache.
TECHNIQUE: Multiplanar, multisequential MRI of the brain was performed without administration of IV contrast.

[Series 5: DWI · axial · 6.0mm · 1.35mm/px · z∈[-58,+95]mm · 14 of 91 slices shown (1 of 3)]
[im 1/91]
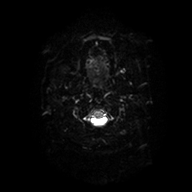
[im 7/91]
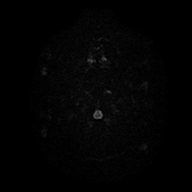
[im 14/91]
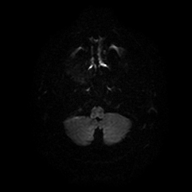
[im 21/91]
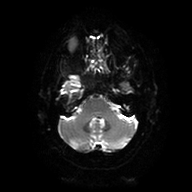
[im 28/91]
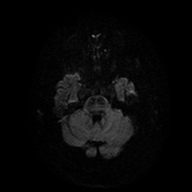
[im 35/91]
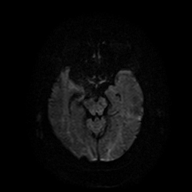
[im 42/91]
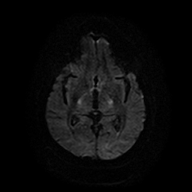
[im 49/91]
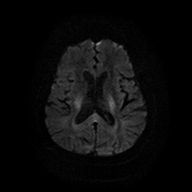
[im 56/91]
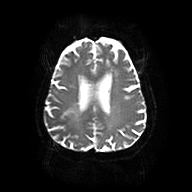
[im 63/91]
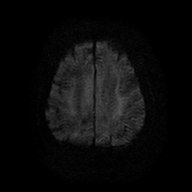
[im 70/91]
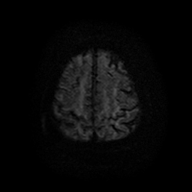
[im 77/91]
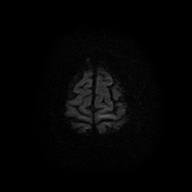
[im 84/91]
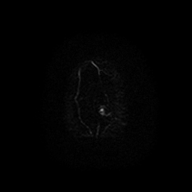
[im 91/91]
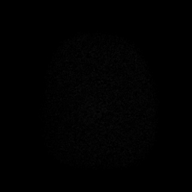

[Series 6: DWI · axial · 6.0mm · 1.35mm/px · z∈[-58,+95]mm · 4 of 23 slices shown (2 of 3)]
[im 1/23]
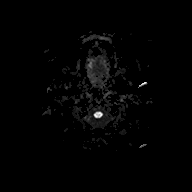
[im 8/23]
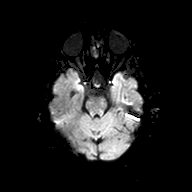
[im 15/23]
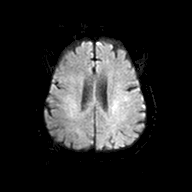
[im 23/23]
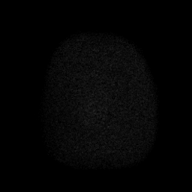

[Series 7: DWI · axial · 6.0mm · 1.35mm/px · z∈[-58,+95]mm · 4 of 21 slices shown (3 of 3)]
[im 1/21]
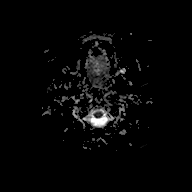
[im 7/21]
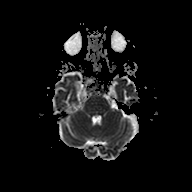
[im 14/21]
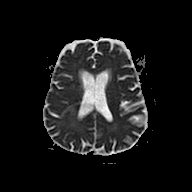
[im 21/21]
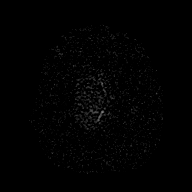

[Series 8: T1 · sagittal · 6.0mm · 0.60mm/px · 3 of 21 slices shown (1 of 3)]
[im 1/21]
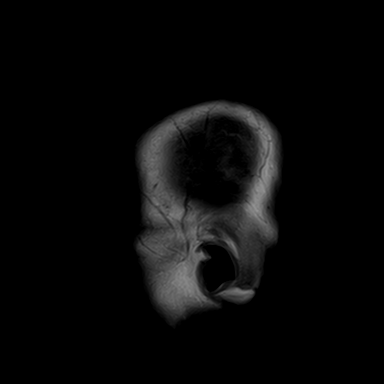
[im 11/21]
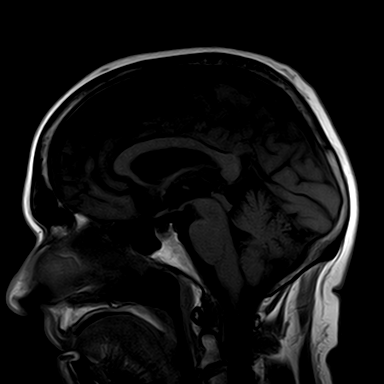
[im 21/21]
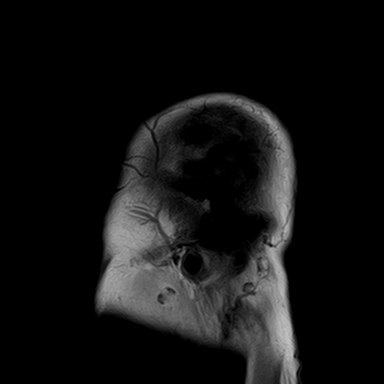

[Series 9: T2 · axial · 5.5mm · 0.57mm/px · z∈[-57,+91]mm · 3 of 24 slices shown]
[im 1/24]
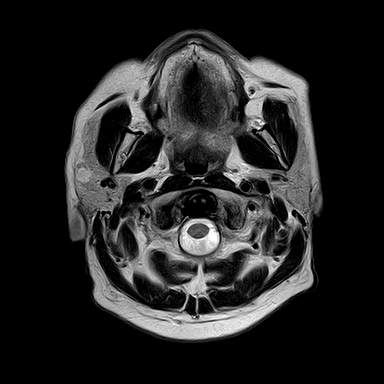
[im 12/24]
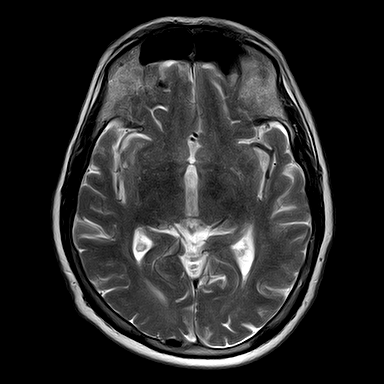
[im 24/24]
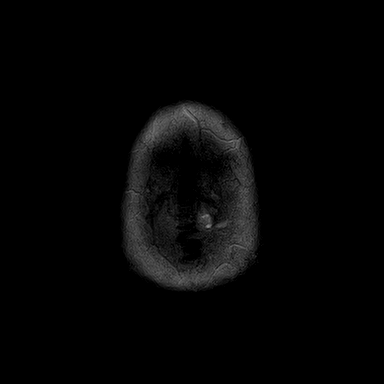

[Series 10: FLAIR · axial · 5.5mm · 0.76mm/px · z∈[-59,+89]mm · 3 of 24 slices shown (1 of 2)]
[im 1/24]
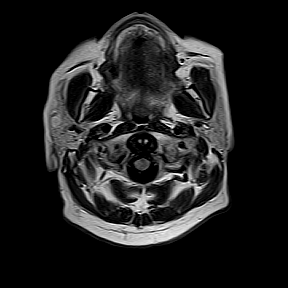
[im 12/24]
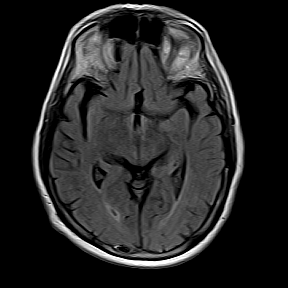
[im 24/24]
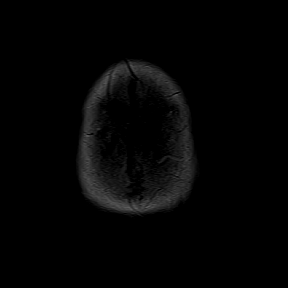

[Series 11: T1 · axial · 5.5mm · 0.69mm/px · z∈[-55,+93]mm · 3 of 24 slices shown (2 of 3)]
[im 1/24]
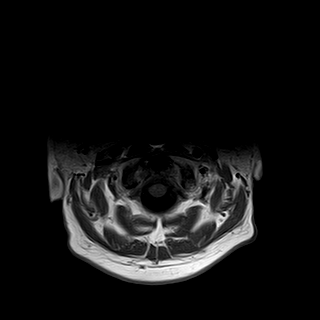
[im 12/24]
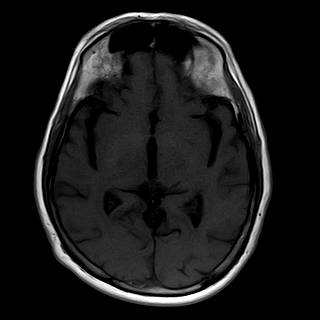
[im 24/24]
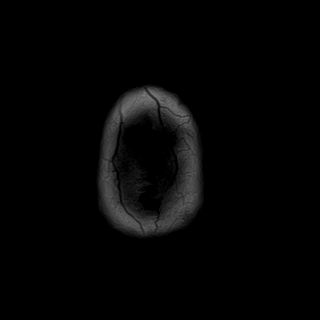

[Series 12: T2-star · axial · 5.5mm · 0.69mm/px · z∈[-57,+91]mm · 3 of 24 slices shown (1 of 2)]
[im 1/24]
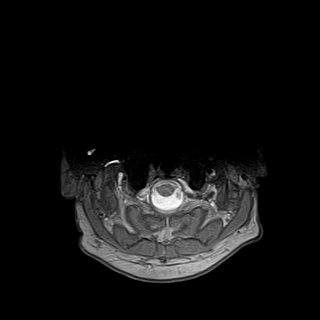
[im 12/24]
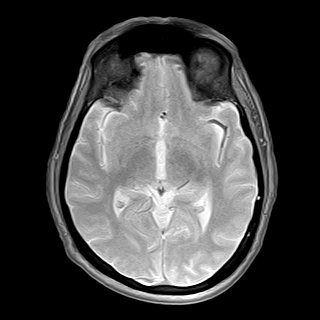
[im 24/24]
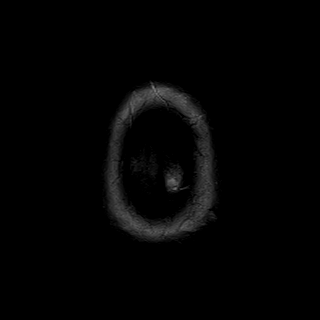

[Series 13: T2-star · axial · 5.5mm · 0.69mm/px · z∈[-59,+89]mm · 3 of 24 slices shown (2 of 2)]
[im 1/24]
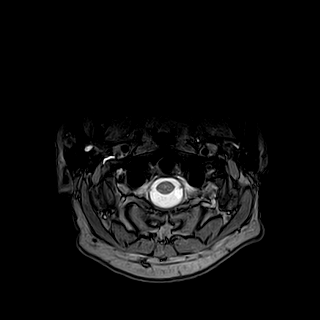
[im 12/24]
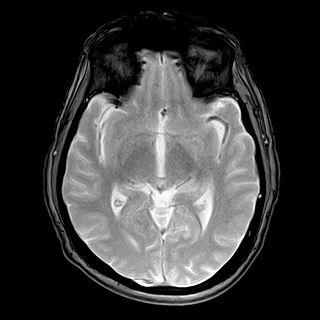
[im 24/24]
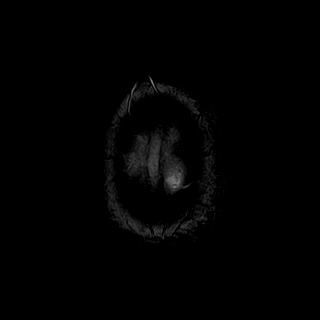

[Series 14: FLAIR · coronal · 6.0mm · 0.69mm/px · 4 of 27 slices shown (2 of 2)]
[im 1/27]
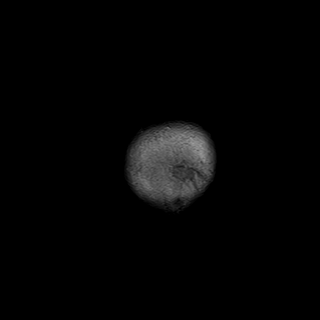
[im 9/27]
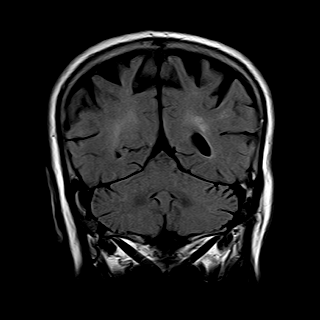
[im 18/27]
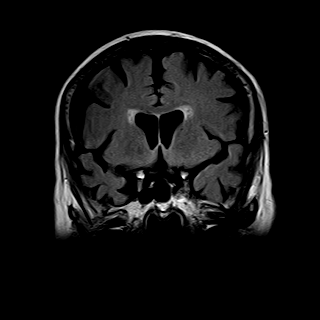
[im 27/27]
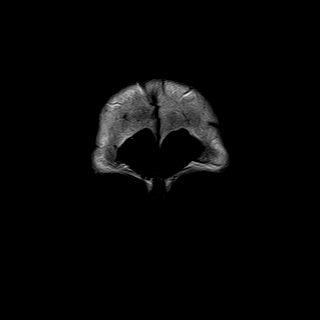

[Series 15: T1 · coronal · 6.0mm · 0.69mm/px · 4 of 27 slices shown (3 of 3)]
[im 1/27]
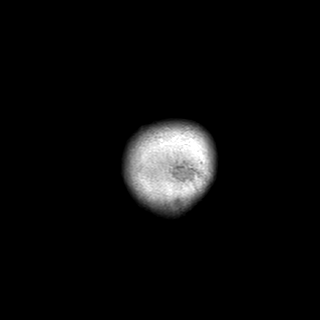
[im 9/27]
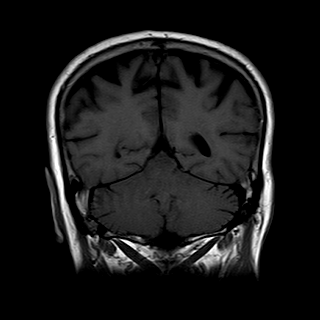
[im 18/27]
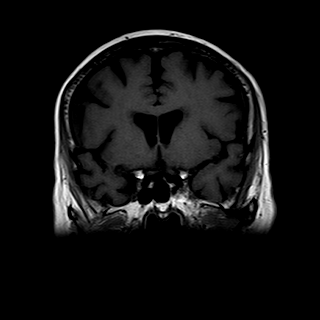
[im 27/27]
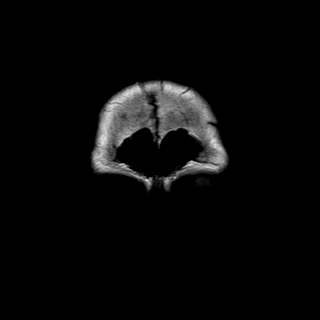

[48 of 48 positions shown; findings below may reference images not displayed]

FINDINGS: No acute ischemia is noted on the diffusion sequence.  No evidence of intracranial bleed or extra-axial collection is seen.  Age-appropriate symmetric global cerebral cortical atrophy.  No ventriculomegaly or midline shift.  Mild chronic small-vessel ischemic change of periventricular white matter.

Major arteries of circle of Willis and dural venous sinuses are patent.  Pituitary gland is normal in appearance.
IMPRESSION: 1. No evidence of acute ischemia or intracranial space-occupying lesions.

2. Age-appropriate global cerebral cortical atrophy.  No ventriculomegaly.

3. Mild chronic small-vessel ischemic change of periventricular white matter in the frontal areas on both sides.

Electronically Signed by ARJU, SOSTA at [id number] [DATE]

## 2023-04-15 ENCOUNTER — Other Ambulatory Visit (INDEPENDENT_AMBULATORY_CARE_PROVIDER_SITE_OTHER): Payer: Self-pay | Admitting: Internal Medicine

## 2023-04-15 DIAGNOSIS — F419 Anxiety disorder, unspecified: Secondary | ICD-10-CM

## 2023-04-15 DIAGNOSIS — I10 Essential (primary) hypertension: Secondary | ICD-10-CM

## 2023-04-29 ENCOUNTER — Other Ambulatory Visit (INDEPENDENT_AMBULATORY_CARE_PROVIDER_SITE_OTHER): Payer: Self-pay | Admitting: Internal Medicine

## 2023-04-29 ENCOUNTER — Encounter (INDEPENDENT_AMBULATORY_CARE_PROVIDER_SITE_OTHER): Payer: Self-pay | Admitting: Internal Medicine

## 2023-04-29 DIAGNOSIS — E559 Vitamin D deficiency, unspecified: Secondary | ICD-10-CM

## 2023-04-29 MED ORDER — ERGOCALCIFEROL (VITAMIN D2) 1,250 MCG (50,000 UNIT) CAPSULE
50000.0000 [IU] | ORAL_CAPSULE | ORAL | 1 refills | Status: DC
Start: 2023-04-29 — End: 2023-04-29

## 2023-04-29 MED ORDER — ERGOCALCIFEROL (VITAMIN D2) 1,250 MCG (50,000 UNIT) CAPSULE
50000.0000 [IU] | ORAL_CAPSULE | ORAL | 0 refills | Status: AC
Start: 2023-04-29 — End: 2023-07-28

## 2023-05-13 ENCOUNTER — Other Ambulatory Visit: Payer: Self-pay

## 2023-05-13 ENCOUNTER — Encounter (INDEPENDENT_AMBULATORY_CARE_PROVIDER_SITE_OTHER): Payer: Self-pay | Admitting: OTOLARYNGOLOGY

## 2023-05-13 ENCOUNTER — Ambulatory Visit: Payer: Medicare Other | Attending: OTOLARYNGOLOGY | Admitting: OTOLARYNGOLOGY

## 2023-05-13 VITALS — Ht 68.0 in | Wt 184.0 lb

## 2023-05-13 DIAGNOSIS — J309 Allergic rhinitis, unspecified: Secondary | ICD-10-CM

## 2023-05-13 DIAGNOSIS — H9313 Tinnitus, bilateral: Secondary | ICD-10-CM | POA: Insufficient documentation

## 2023-05-13 DIAGNOSIS — H6123 Impacted cerumen, bilateral: Secondary | ICD-10-CM | POA: Insufficient documentation

## 2023-05-13 DIAGNOSIS — K219 Gastro-esophageal reflux disease without esophagitis: Secondary | ICD-10-CM | POA: Insufficient documentation

## 2023-05-13 DIAGNOSIS — J301 Allergic rhinitis due to pollen: Secondary | ICD-10-CM | POA: Insufficient documentation

## 2023-05-13 DIAGNOSIS — L989 Disorder of the skin and subcutaneous tissue, unspecified: Secondary | ICD-10-CM

## 2023-05-13 DIAGNOSIS — H903 Sensorineural hearing loss, bilateral: Secondary | ICD-10-CM | POA: Insufficient documentation

## 2023-05-13 NOTE — Procedures (Signed)
[facility], [facility]  [address]  [place] [address]  Operated by [facility]  Procedure Note    Name: Jason Jacobs MRN:  [id number]   Date: 05/13/2023 DOB:  August 21, 1945 (77 y.o.)         [phone] - REMOVAL IMPACTED CERUMEN W/ INSTRUMENT, UNILATERAL (AMB ONLY-PD)    Performed by: Lonia Farber, DO  Authorized by: Lonia Farber, DO    Time Out:     Immediately before the procedure, a time out was called:  Yes    Patient verified:  Yes    Procedure Verified:  Yes    Site Verified:  Yes  Procedure: Cerumen cleaning  Pre-op Dx: Cerumen impaction      Bilateral EAC(s) examined under binocular microscopy.  Cerumen and/or debris was cleaned from the canal(s) using curettes, suction, and alligator forceps.  Patient tolerated procedure well.  ENT was present for the entire procedure.     Lonia Farber, DO

## 2023-05-13 NOTE — H&P (Signed)
ENT, [facility]  [address]  [place] [address]  Operated by [facility]      Name: Jason Jacobs MRN:  [id number]   Date: 05/13/2023 DOB: [birth year] (77 y.o.)       Referring Provider:  No ref. provider found    Reason for Visit:   Chief Complaint   Patient presents with    Skin Check     RC skin         History of Present Illness:  Jason Jacobs is a 77 y.o. male who is is FU on skin lesions/ LPR/ SNHL/ AR. He c/o aural fullness and clogging. No other complaints.       Patient History:  Patient Active Problem List   Diagnosis    Arteriosclerotic cardiovascular disease (ASCVD)    Carotid artery stenosis    Hyperlipidemia    Epileptic seizure (CMS HCC)    Essential hypertension    Esophageal reflux    Anxiety    Dementia (CMS HCC)    Nephrolithiasis    Bilateral hearing loss, unspecified hearing loss type     Current Outpatient Medications   Medication Sig    amLODIPine (NORVASC) 10 mg Oral Tablet TAKE 1 TABLET ONCE DAILY   FOR HIGH BLOOD PRESSURE    aspirin (ECOTRIN) 81 mg Oral Tablet, Delayed Release (E.C.) Take 1 Tablet (81 mg total) by mouth    cyanocobalamin (VITAMIN B-12) 1,000 mcg Oral Tablet Take 1 Tablet (1,000 mcg total) by mouth Once a day    ergocalciferol, vitamin D2, (DRISDOL) 1,250 mcg (50,000 unit) Oral Capsule Take 1 Capsule (50,000 Units total) by mouth Every 14 days for 90 days Indications: vitamin D deficiency (high dose therapy)    FLUoxetine (PROZAC) 40 mg Oral Capsule TAKE 1 CAPSULE DAILY FOR   ANXIOUSNESS ASSOCIATED WITHDEPRESSION.    Levetiracetam 750 mg Oral Tablet Sustained Release 24 hr Take 1 Tablet (750 mg total) by mouth Twice daily    omeprazole (PRILOSEC) 20 mg Oral Capsule, Delayed Release(E.C.) Take 1 Capsule (20 mg total) by mouth Once a day    ondansetron (ZOFRAN ODT) 4 mg Oral Tablet, Rapid Dissolve Take 1 Tablet (4 mg total) by mouth Every 8 hours as needed for Nausea/Vomiting    Perindopril Erbumine (ACEON) 4 mg Oral Tablet Take 1 Tablet (4 mg  total) by mouth Once a day    rosuvastatin (CRESTOR) 5 mg Oral Tablet Take 1 Tablet (5 mg total) by mouth Once a day    tamsulosin (FLOMAX) 0.4 mg Oral Capsule Take 1 Capsule (0.4 mg total) by mouth Once a day      No Known Allergies  Past Medical History:   Diagnosis Date    Anxiety     Carotid stenosis, left     Chronic obstructive airway disease (CMS HCC)     Coronary artery disease     Dementia (CMS HCC)     Elevated PSA     Epileptic seizure (CMS HCC)     Esophageal reflux     Essential hypertension     Hearing loss     Hyperlipidemia     Meniere's disease     PAD (peripheral artery disease) (CMS HCC)      Past Surgical History:   Procedure Laterality Date    HX CORONARY ARTERY BYPASS GRAFT       Family Medical History:       Problem Relation (Age of Onset)    Cancer Other    Elevated  Lipids Mother    Emphysema Father    Heart Attack Father    Migraines Other    Sleep apnea Other            Social History     Tobacco Use    Smoking status: Never    Smokeless tobacco: Never   Substance Use Topics    Alcohol use: Never    Drug use: Never       Review of Systems:  Review of Systems    Physical Exam:  Ht 1.727 m (5\' 8" )   Wt 83.5 kg (184 lb)   BMI 27.98 kg/m       Physical Exam  Constitutional:       Appearance: Normal appearance. He is well-developed, well-groomed and normal weight.   HENT:      Head: Normocephalic and atraumatic.      Right Ear: Hearing, tympanic membrane, ear canal and external ear normal. There is impacted cerumen.      Left Ear: Hearing, tympanic membrane, ear canal and external ear normal. There is impacted cerumen.      Nose: Septal deviation and mucosal edema present.      Right Turbinates: Enlarged.      Left Turbinates: Enlarged.      Mouth/Throat:      Lips: Pink.      Mouth: Mucous membranes are moist.      Pharynx: Oropharynx is clear. Uvula midline.   Eyes:      Extraocular Movements: Extraocular movements intact.   Neck:      Trachea: Phonation normal.   Pulmonary:      Effort:  Pulmonary effort is normal.   Musculoskeletal:      Cervical back: Normal range of motion and neck supple.   Lymphadenopathy:      Cervical: No cervical adenopathy.   Skin:     General: Skin is warm.   Neurological:      Mental Status: He is alert and oriented to person, place, and time.      Cranial Nerves: Cranial nerves 2-12 are intact. No facial asymmetry.   Psychiatric:         Attention and Perception: Attention normal.         Mood and Affect: Mood normal.         Speech: Speech normal.         Behavior: Behavior normal. Behavior is cooperative.          Assessment:  ENCOUNTER DIAGNOSES     ICD-10-CM   1. 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 Gastroesophageal reflux disease without esophagitis  K21.9   5. Bilateral impacted cerumen  H61.23   6. 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  AU cerumen debrided.   Continue hearing aid use  Nasal saline b.i.d.  Reflux precautions/diet modifications  Continue Prilosec daily  Orders Placed This Encounter    [phone] - REMOVAL IMPACTED CERUMEN W/ INSTRUMENT, UNILATERAL (AMB ONLY-PD)     Follow-up in 8-12 months or sooner PRN      The advanced practice clinician's documentation was reviewed/amended in its entirety with the assessment and plan portion completely performed independently by me during this encounter.   Lonia Farber, D.O., MMS  ENT / Facial Plastic Surgery      Gavin Pound  P Taren Dymek, PA-C    I appreciate the opportunity to be involved in the care of your patients.  If you have any questions or concerns regarding this encounter, please do not hesitate to contact me at your convenience.        This note may have been partially generated using MModal Fluency Direct system, and there may be some incorrect words, spellings, and punctuation that were not noted in checking the note before saving, though effort  was made to avoid such errors.

## 2023-05-25 ENCOUNTER — Other Ambulatory Visit (INDEPENDENT_AMBULATORY_CARE_PROVIDER_SITE_OTHER): Payer: Self-pay | Admitting: Internal Medicine

## 2023-05-25 DIAGNOSIS — G40909 Epilepsy, unspecified, not intractable, without status epilepticus: Secondary | ICD-10-CM

## 2023-05-25 MED ORDER — LEVETIRACETAM ER 750 MG TABLET,EXTENDED RELEASE 24 HR
1.0000 | ORAL_TABLET | Freq: Two times a day (BID) | ORAL | 1 refills | Status: DC
Start: 2023-05-25 — End: 2023-08-12

## 2023-08-12 ENCOUNTER — Other Ambulatory Visit (INDEPENDENT_AMBULATORY_CARE_PROVIDER_SITE_OTHER): Payer: Self-pay | Admitting: Internal Medicine

## 2023-08-12 DIAGNOSIS — G40909 Epilepsy, unspecified, not intractable, without status epilepticus: Secondary | ICD-10-CM

## 2023-08-12 MED ORDER — LEVETIRACETAM ER 750 MG TABLET,EXTENDED RELEASE 24 HR
1.0000 | ORAL_TABLET | Freq: Two times a day (BID) | ORAL | 0 refills | Status: DC
Start: 2023-08-12 — End: 2023-11-11

## 2023-08-18 ENCOUNTER — Encounter (INDEPENDENT_AMBULATORY_CARE_PROVIDER_SITE_OTHER): Payer: Self-pay | Admitting: Internal Medicine

## 2023-09-02 ENCOUNTER — Encounter (INDEPENDENT_AMBULATORY_CARE_PROVIDER_SITE_OTHER): Payer: Self-pay | Admitting: Internal Medicine

## 2023-09-06 ENCOUNTER — Other Ambulatory Visit (INDEPENDENT_AMBULATORY_CARE_PROVIDER_SITE_OTHER): Payer: Self-pay | Admitting: Internal Medicine

## 2023-09-06 DIAGNOSIS — F419 Anxiety disorder, unspecified: Secondary | ICD-10-CM

## 2023-09-14 ENCOUNTER — Ambulatory Visit (INDEPENDENT_AMBULATORY_CARE_PROVIDER_SITE_OTHER): Admitting: Internal Medicine

## 2023-10-01 ENCOUNTER — Encounter (INDEPENDENT_AMBULATORY_CARE_PROVIDER_SITE_OTHER): Payer: Self-pay | Admitting: NEUROLOGY

## 2023-10-01 ENCOUNTER — Ambulatory Visit (INDEPENDENT_AMBULATORY_CARE_PROVIDER_SITE_OTHER): Payer: Self-pay | Admitting: NEUROLOGY

## 2023-10-01 ENCOUNTER — Other Ambulatory Visit: Payer: Self-pay

## 2023-10-01 VITALS — BP 112/58 | HR 64 | Temp 97.5°F | Wt 184.0 lb

## 2023-10-01 DIAGNOSIS — R4189 Other symptoms and signs involving cognitive functions and awareness: Secondary | ICD-10-CM

## 2023-10-01 DIAGNOSIS — G40909 Epilepsy, unspecified, not intractable, without status epilepticus: Secondary | ICD-10-CM

## 2023-10-01 MED ORDER — DONEPEZIL 5 MG TABLET
5.0000 mg | ORAL_TABLET | Freq: Every evening | ORAL | 0 refills | Status: DC
Start: 2023-10-01 — End: 2023-12-23

## 2023-10-01 NOTE — Progress Notes (Unsigned)
 Assessment & Plan  Cognitive decline  He is a 78 year old man who is referred for longstanding history of cognitive decline as well as seizures. Initial MOCA testing revealed a score of 20/30. DSRS was 9. He has been having cognitive issues for a number of years since he was seeing his previous neurologist. He is having some difficulty with short term recall and has some issues with directions, however, wife still feels very comfortable with his ability to drive. Recent MRI was unremarkable per read. We discussed options for further workup including amyloid PET, lumbar puncture and neuropsych testing. We discussed the roles of these tests as well as new amyloid targeting therapies in Alzheimer's pathology. They would prefer to forego aggressive workup for the time being. We will go ahead and start donepezil  and monitor response.    - counseled on therapeutics and options for further workup  - start donepezil  5mg  QHS  - counseled on diet and lifestyle modifications known to be beneficial    Epileptic seizure (CMS HCC)  These are characterized by behavioral arrest lasting 2-3 minutes. Followed by 20 minutes of postictal confusion. There is no clear aura and no motor automatisms during events. 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 He has been on Keppra  for a number of years now with no further episodes. He and wife believe there have only been 4-5 events total. He is currently prescribed 750mg  sustained release BID which is not clearly indicated in prior neurology notes. This would be equivalent of 1500mg  BID. I have talked with PCP who has been filling this and reports that she refilled what patient came to her on. I'm not sure he needs this dose. Regardless, he is tolerating well so will continue for the time being.     - continue Keppra  SR 750mg  BID (would consider weaning to 1000 mg IR BID)    RTC 3 months    Thank you for allowing me to participate in your patient's care and please do  not hesitate to contact me for any questions or concerns.    S. Zach Jason Boyar, DO  Assistant Professor of Neurology  [place]  [facility]     I personally spent a total of 45 minutes today preparing to see the patient, in the encounter with the patient, and documenting after the visit.    [id number]: I will continue to be the provider focal point in managing the chronic complex neurological condition    ==========================================================================================================================================    NAME:  Jason Jacobs  DOB:  May 21, 1946  VISIT DATE:  10/01/2023    CC:  seizure/cognitive decline    Patient seen in consultation at the request of Dr. Exie Jacobs  History obtained from the patient and chart/records  Age of patient:  78 y.o.      HPI:   I had the pleasure of seeing your patient in neurology clinic for an outpatient consultation, who is a 78 y.o. year old male who was referred for evaluation of seizure and cognitive decline.  Please allow me to summarize the history for the record.    He is joined by wife who gives additional details. He was previously seeing Dr. Eliott Jacobs. In early 2000s, he began having occasional episodes of behavioral arrest. Wife states he would stare and not respond for up to 2-3 minutes. He denies ever having a preceding aura aside from once when he experienced numbness around his head prior to onset. He was amnestic to these events.  No automatisms that wife ever saw. They were occurring maybe once every 6 months. He reportedly had an abnormal EEG and Dr. Eliott Jacobs started him on Keppra . He has been seizure free seince that time (2013).   He also thinks he has been having mild cognitive issues since around that time too. They have become a little more noticeable. His wife reports that he has trouble with directions in familiar areas though reports his driving is still safe. He forgets recent conversations. He denies any  AV hallucinations. No changes to gait.     ============================================================================================================================================  PMHx  Patient Active Problem List   Diagnosis    Arteriosclerotic cardiovascular disease (ASCVD)    Carotid artery stenosis    Hyperlipidemia    Epileptic seizure (CMS HCC)    Essential hypertension    Esophageal reflux    Anxiety    Dementia    Nephrolithiasis    Bilateral hearing loss, unspecified hearing loss type     Past Surgical History:   Procedure Laterality Date    HX CORONARY ARTERY BYPASS GRAFT           Family Medical History:       Problem Relation (Age of Onset)    Alzheimer's/Dementia Mother (45)    Cancer Other    Elevated Lipids Mother    Emphysema Father    Heart Attack Father    Migraines Other    Sleep apnea Other            Current Outpatient Medications   Medication Sig Dispense Refill    amLODIPine  (NORVASC) 10 mg Oral Tablet TAKE 1 TABLET ONCE DAILY   FOR HIGH BLOOD PRESSURE 90 Tablet 3    aspirin (ECOTRIN) 81 mg Oral Tablet, Delayed Release (E.C.) Take 1 Tablet (81 mg total) by mouth      cholecalciferol, vitamin D3, 250 mcg (10,000 unit) Oral Capsule Take 1 Capsule (10,000 Units total) by mouth Every 7 days      cyanocobalamin  (VITAMIN B-12) 1,000 mcg Oral Tablet Take 1 Tablet (1,000 mcg total) by mouth Once a day      donepeziL  (ARICEPT ) 5 mg Oral Tablet Take 1 Tablet (5 mg total) by mouth Every night for 90 days 90 Tablet 0    FLUoxetine  (PROZAC ) 40 mg Oral Capsule TAKE 1 CAPSULE DAILY FOR   ANXIOUSNESS ASSOCIATED WITHDEPRESSION. 90 Capsule 0    Levetiracetam  750 mg Oral Tablet Sustained Release 24 hr Take 1 Tablet (750 mg total) by mouth Twice daily 180 Tablet 0    omeprazole  (PRILOSEC) 20 mg Oral Capsule, Delayed Release(E.C.) Take 1 Capsule (20 mg total) by mouth Once a day 90 Capsule 3    ondansetron  (ZOFRAN  ODT) 4 mg Oral Tablet, Rapid Dissolve Take 1 Tablet (4 mg total) by mouth Every 8 hours as  needed for Nausea/Vomiting (Patient not taking: Reported on 10/06/2023) 12 Tablet 0    Perindopril  Erbumine (ACEON ) 4 mg Oral Tablet Take 1 Tablet (4 mg total) by mouth Once a day 90 Tablet 3    rosuvastatin  (CRESTOR ) 5 mg Oral Tablet Take 1 Tablet (5 mg total) by mouth Once a day 90 Tablet 3    tamsulosin  (FLOMAX ) 0.4 mg Oral Capsule Take 1 Capsule (0.4 mg total) by mouth Once a day 90 Capsule 3     No current facility-administered medications for this visit.     No Known Allergies  Social History     Socioeconomic History    Marital status: Married  Spouse name: Not on file    Number of children: Not on file    Years of education: Not on file    Highest education level: Not on file   Occupational History    Not on file   Tobacco Use    Smoking status: Never    Smokeless tobacco: Never   Vaping Use    Vaping status: Never Used   Substance and Sexual Activity    Alcohol use: Never    Drug use: Never    Sexual activity: Not on file   Other Topics Concern    Not on file   Social History Narrative    Not on file     Social Determinants of Health     Financial Resource Strain: Not on file   Transportation Needs: Not on file   Social Connections: Not on file   Intimate Partner Violence: Not on file   Housing Stability: Not on file       ============================================================================================================================================  GENERAL EXAMINATION  BP (!) 112/58 (Site: Left Arm, Patient Position: Sitting)   Pulse 64   Temp 36.4 C (97.5 F) (Temporal)   Wt 83.5 kg (184 lb)   SpO2 94%   BMI 27.98 kg/m     Vital signs personally reviewed  General: No acute distress, alert  HEENT: Normocephalic, no scleral icterus  Extremities: No significant edema, No cyanosis    NEUROLOGIC EXAM  On neurological exam, patient was awake, alert and answering questions appropriately  Speech was fluent, without dysarthria or aphasia.    CN  II: not directly tested, grossly intact  III,  IV, VI: extraocular movements intact without nystagmus  V: intact to light touch  VII: face symmetric without weakness  VIII: grossly intact  IX, X: symmetric palatal elevation  XI: normal strength of trapezius and sternocleidomastoid bilaterally  XII: tongue midline with full movements    MOTOR  Bulk: normal  Tone: normal  Abnormal Movements: none    Strength:     MRC Grading Scale   Right Left   Deltoid 5 5   Biceps 5 5   Triceps 5 5   Wrist Extension 5 5   Wrist Flexion - -   Finger Extension 5 5   Finger Abduction 5 5   Finger Flexion 5 5   Hip Flexion 5 5   Hip Extension - -   Hip Abduction - -   Hip Adduction - -   Knee Extension 5 5   Knee Flexion 5 5   Ankle Dorsiflexion - -   Ankle Plantarflexion - -   Toe Extension - -   Toe Flexion - -     REFLEXES   Right Left   Biceps 2 2   Triceps 2 2   Brachioradialis 2 2   Patellar 2 2   Achilles - -   Plantar - -   Hoffman - -   Pectoralis - -   Jaw Jerk - -       SENSORY  Light touch: intact throughout    GAIT  General: casual, normal gait    COORDINATION  Finger nose finger: normal  ================================================================================================================================LABS  Personal Review of prior labs is notable for:   2024  TSH WNL   B12 244  CMP mildly elevated creatinine  CBC largely WNL   IMAGING  Personal Review of imaging is notable for:   MRI Brain October 2024  Largely unremarkable for age per read  OTHER DIAGNOSTICS  Personal Review of other prior diagnostics is notable for: not applicable

## 2023-10-01 NOTE — Patient Instructions (Signed)
 The six pillars of a brain-healthy, dementia prevention lifestyle are:    1.Regular exercise - aim for 150 minutes of aerobic activity a week, as well as strengthening and balance exercises    2.Healthy diet - look into the MIND diet, which is a combination of the Mediterranean diet and the heart-healthy DASH diet    3.Mental stimulation - keep your mind active with brain teasers, crossword puzzles, reading, and learning new skills (a new language, playing an instrument or new hobby)    4.Quality sleep - make sure you go to bed and wake up around the same times every day, napping is okay in the early afternoon for no more than 45-60 minutes, get tested for sleep apnea if your snore    5.Stress management - practice deep breathing exercises, look into meditation (free modules at [employer]), make sure to do activities you enjoy daily    6.An active social life - volunteer, join a social group, make a weekly lunch date with friends, get out in public places (movies, museums, parks)    Also make sure that your taking good care of your health otherwise, with good control of blood pressure, cholesterol and blood sugar.    These types of lifestyle changes have been shown to be much more effective for preventing the progression of dementia than any of the prescription medications we currently have available.  The more you strengthen each of the six pillars in your daily life, the healthier and hardier your brain will be. When you lead a brain-healthy lifestyle, your brain will stay working stronger. longer.        EXERCISE  Evidence suggests that regular exercise slows down the progression of neurodegenerative diseases (diseases caused by the death of brain cells), and it clearly improves quality of life. The optimal exercise routine  includes a cardiovascular work out, stretching and strengthening.  A 30 - 45 minute work out 5 times a week is best, but start slowly (10 minutes at a time) and build up over weeks.  Find  a work-out buddy, as people are more successful when they have someone working out with them.    The recommended type of exercise is an individual choice, because it must be a regimen you enjoy and that will be done regularly for it to be beneficial.  Walking briskly is an excellent routine for many.  Studies show that varying the intensity of exercise, called interval training, is more effective than exercising at the same level for a longer period.  Try walking as quickly as you can for 3 minutes, then recover and walk more slowly for 3 minutes.  Repeat this pattern 5 times for a total workout of 30 minutes.  Swimming or aquarobics is also excellent, especially for those with arthritis.    Remember, by choosing not to work out today, you are saying "I'm going to let those brain cells die today"!  You can save them with exercise!  EXERCISE IS THE ONLY INTERVENTION THAT HAS BEEN SHOWN TO SLOW DOWN THE PROGRESSION OF THE DISEASE.      DIET  The MIND diet is a combination of the Mediterranean diet and the DASH (Dietary Approaches to Stop Hypertension) diet. These diets individually have been shown to reduce the risk of cardiovascular disease, like hypertension, heart attack and stroke. Combined, there is newer evidence that following this diet can slow cognitive decline and even prevent progression to dementia. A study by [facility] found that older adults  who followed the diet rigorously showed an equivalent of being 7.5 years younger cognitively than those who followed the diet least.    To benefit from the MIND diet, a person should:       Overall, one should strive to eat a mostly plant-based, natural diet, limiting processed foods that come out of boxes or wrappers.    SLEEP  - Sleep only as much as you need to feel well rested. Spending LESS time in bed will help  you sleep better while you are in bed.    - Keep a regular sleep schedule: It is most important to GET UP at the same time every  day,  regardless of how well you have slept the night before. This is hard. You will get better  at it with practice.    - Establish relaxing pre-sleep rituals, such as a warm bath, light snack or reading.    - Don't work at falling asleep. Stop pressuring yourself to sleep.    - Reduce unwanted noise and light in your bedroom. (Use a white noise such as a fan, room  darkening shades.) Keep a comfortable bedroom temperature. Have a comfortable bed.    - Avoid caffeine, alcohol and tobacco, especially anytime after early afternoon.    - Exercise every day, but not within 3-4 hours before bedtime.    - Don't use your bed, or the bedroom, for anything but sleep and sex. Do your pre-sleep  rituals like reading somewhere else.    - Don't lie awake if you can't get to seep, or on awakening, for more than 10-20 minutes.  Get up and do something else which is quiet, not-stimulating, or even boring.  (NOT TV, computer or video games: These are designed to be stimulating.)  Go back to bed when you feel sleepy.  Repeat this as often as needed.    - Avoid excessive napping. Keep naps brief, 45-60 minutes maximum. Early afternoon is  the best time. Avoid napping if you are not refreshed after the nap.    - Practice relaxation techniques: muscle relaxation (start with head or toes and move down  or up your body), sighing, deep breathing (2, slow, deep breaths), counting, practicing  creating pleasing mental images.     - Consider using bright light therapy in the morning.  These lights can be purchased on [employer] for about $30-$60.  They should be 10,000 lux, with no UV light.  Start with 10 minutes of exposure in the morning, as you're eating breakfast or drinking your coffee.  Place the light at around eye level, about 2 feet away from your face, turned at a 45 degree angle.  You can increase your exposure to up to 60 minutes in the morning over time.

## 2023-10-06 ENCOUNTER — Ambulatory Visit (HOSPITAL_BASED_OUTPATIENT_CLINIC_OR_DEPARTMENT_OTHER): Admitting: Internal Medicine

## 2023-10-06 ENCOUNTER — Ambulatory Visit (INDEPENDENT_AMBULATORY_CARE_PROVIDER_SITE_OTHER): Payer: Self-pay | Admitting: Internal Medicine

## 2023-10-06 ENCOUNTER — Encounter (INDEPENDENT_AMBULATORY_CARE_PROVIDER_SITE_OTHER): Payer: Self-pay | Admitting: Internal Medicine

## 2023-10-06 ENCOUNTER — Ambulatory Visit: Attending: Internal Medicine | Admitting: Internal Medicine

## 2023-10-06 ENCOUNTER — Other Ambulatory Visit: Payer: Self-pay

## 2023-10-06 VITALS — BP 131/64 | HR 60 | Temp 97.9°F | Resp 18 | Ht 68.0 in | Wt 183.0 lb

## 2023-10-06 VITALS — BP 131/64 | HR 60 | Temp 97.0°F | Resp 18 | Ht 68.0 in | Wt 183.0 lb

## 2023-10-06 DIAGNOSIS — Z1159 Encounter for screening for other viral diseases: Secondary | ICD-10-CM | POA: Insufficient documentation

## 2023-10-06 DIAGNOSIS — K219 Gastro-esophageal reflux disease without esophagitis: Secondary | ICD-10-CM | POA: Insufficient documentation

## 2023-10-06 DIAGNOSIS — E785 Hyperlipidemia, unspecified: Secondary | ICD-10-CM | POA: Insufficient documentation

## 2023-10-06 DIAGNOSIS — I10 Essential (primary) hypertension: Secondary | ICD-10-CM | POA: Insufficient documentation

## 2023-10-06 DIAGNOSIS — I6529 Occlusion and stenosis of unspecified carotid artery: Secondary | ICD-10-CM | POA: Insufficient documentation

## 2023-10-06 DIAGNOSIS — Z0000 Encounter for general adult medical examination without abnormal findings: Secondary | ICD-10-CM

## 2023-10-06 DIAGNOSIS — E559 Vitamin D deficiency, unspecified: Secondary | ICD-10-CM | POA: Insufficient documentation

## 2023-10-06 DIAGNOSIS — I2510 Atherosclerotic heart disease of native coronary artery without angina pectoris: Secondary | ICD-10-CM | POA: Insufficient documentation

## 2023-10-06 DIAGNOSIS — F0390 Unspecified dementia without behavioral disturbance: Secondary | ICD-10-CM | POA: Insufficient documentation

## 2023-10-06 DIAGNOSIS — G40909 Epilepsy, unspecified, not intractable, without status epilepticus: Secondary | ICD-10-CM

## 2023-10-06 DIAGNOSIS — E538 Deficiency of other specified B group vitamins: Secondary | ICD-10-CM | POA: Insufficient documentation

## 2023-10-06 LAB — COMPREHENSIVE METABOLIC PANEL, NON-FASTING
ALBUMIN/GLOBULIN RATIO: 1.5 — ABNORMAL HIGH (ref 0.8–1.4)
ALBUMIN: 4.3 g/dL (ref 3.5–5.7)
ALKALINE PHOSPHATASE: 69 U/L (ref 34–104)
ALT (SGPT): 32 U/L (ref 7–52)
ANION GAP: 4 mmol/L (ref 4–13)
AST (SGOT): 26 U/L (ref 13–39)
BILIRUBIN TOTAL: 0.6 mg/dL (ref 0.3–1.0)
BUN/CREA RATIO: 19 (ref 6–22)
BUN: 20 mg/dL (ref 7–25)
CALCIUM, CORRECTED: 9.1 mg/dL (ref 8.9–10.8)
CALCIUM: 9.3 mg/dL (ref 8.6–10.3)
CHLORIDE: 106 mmol/L (ref 98–107)
CO2 TOTAL: 26 mmol/L (ref 21–31)
CREATININE: 1.08 mg/dL (ref 0.60–1.30)
ESTIMATED GFR: 70 mL/min/{1.73_m2} (ref >59)
GLOBULIN: 2.9 (ref 2.0–3.5)
GLUCOSE: 98 mg/dL (ref 74–109)
OSMOLALITY, CALCULATED: 275 mosm/kg (ref 270–290)
POTASSIUM: 4 mmol/L (ref 3.5–5.1)
PROTEIN TOTAL: 7.2 g/dL (ref 6.4–8.9)
SODIUM: 136 mmol/L (ref 136–145)

## 2023-10-06 LAB — CBC WITH DIFF
BASOPHIL #: 0 10*3/uL (ref 0.00–0.10)
BASOPHIL %: 1 % (ref 0–1)
EOSINOPHIL #: 0.3 10*3/uL (ref 0.00–0.50)
EOSINOPHIL %: 3 % (ref 1–8)
HCT: 42.4 % (ref 36.7–47.1)
HGB: 14.5 g/dL (ref 12.5–16.3)
LYMPHOCYTE #: 2.5 10*3/uL (ref 1.00–3.20)
LYMPHOCYTE %: 33 % (ref 15–43)
MCH: 31 pg (ref 23.8–33.4)
MCHC: 34.3 g/dL (ref 32.5–36.3)
MCV: 90.5 fL (ref 73.0–96.2)
MONOCYTE #: 0.9 10*3/uL (ref 0.30–1.10)
MONOCYTE %: 12 % (ref 6–14)
MPV: 7.7 fL (ref 7.4–11.4)
NEUTROPHIL #: 3.9 10*3/uL (ref 1.70–7.60)
NEUTROPHIL %: 52 % (ref 44–74)
PLATELETS: 224 10*3/uL (ref 140–440)
RBC: 4.68 10*6/uL (ref 4.06–5.63)
RDW: 13.1 % (ref 12.1–16.2)
WBC: 7.6 10*3/uL (ref 3.6–10.2)

## 2023-10-06 LAB — LIPID PANEL
CHOL/HDL RATIO: 4.7
CHOLESTEROL: 141 mg/dL (ref <200)
HDL CHOL: 30 mg/dL — ABNORMAL LOW (ref >=40)
LDL CALC: 77 mg/dL (ref 0–100)
TRIGLYCERIDES: 172 mg/dL — ABNORMAL HIGH (ref <=150)
VLDL CALC: 34 mg/dL (ref 0–50)

## 2023-10-06 LAB — VITAMIN B12: VITAMIN B 12: 695 pg/mL (ref 180–914)

## 2023-10-06 LAB — VITAMIN D 25 TOTAL: VITAMIN D 25, TOTAL: 72.61 ng/mL (ref 30.00–100.00)

## 2023-10-06 NOTE — Progress Notes (Signed)
 FAMILY MEDICINE, MEDICAL OFFICE BUILDING  [address]  [place] [address]  Operated by [facility]  Medicare Annual Wellness Visit    Name: Jason Jacobs MRN:  [id number]   Date: 10/06/2023 Age: 78 y.o.       SUBJECTIVE:   Jason Jacobs is a 78 y.o. male for presenting for Medicare Wellness exam.   I have reviewed and reconciled the medication list with the patient today.        10/06/2023     9:55 AM   Comprehensive Health Assessment-Adult   Do you wish to complete this form? Yes   During the past 4 weeks, how would you rate your health in general? Good   During the past 4 weeks, how much difficulty have you had doing your usual activities inside and outside your home because of medical or emotional problems? A little bit of difficulty   During the past 4 weeks, was someone available to help you if you needed and wanted help? Yes, as much as I wanted   In the past year, how many times have you gone to the emergency department or been admitted to a hospital for a health problem? None   Are you generally satisfied with your sleep? Yes   Do you have enough money to buy things you need in everyday life, such as food, clothing, medicines, and housing? Yes, always   Can you get to places beyond walking distance without help?  (For example, can you drive your own car or travel alone on buses)? Yes   Do you fasten your seatbelt when you are in a car? Yes, usually   Do you exercise 20 minutes 3 or more days per week (such as walking, dancing, biking, mowing grass, swimming)? Yes, some of the time   How often do you eat food that is healthy (fruits, vegetables, lean meats) instead of unhealthy (sweets, fast food, junk food, fatty foods)? Some of the time   Have your parents, brothers or sisters had any of the following problems before the age of 59? (check all that apply) Heart problems, or hardening of the arteries;Alcohol or drug addiction (or abuse)   How often do you have trouble taking medicines the  eay you are told to take them? I always take them as prescribed   Do you need any help communicating with your doctors and nurses because of vision or hearing problems? No   During the past 12 months, have you experienced confusion or memory loss that is happening more often or is getting worse? Yes   Do you have one person you think of as your personal doctor (primary care provider or family doctor)? Yes   If you are seeing a Primary Care Provider (PCP) or family doctor. please list their name Dr.Johnrobert Foti   Are you now also seeing any specialist physician(s) (such as eye doctor, foot doctor, skin doctor)? Yes   If you are seeing a specialist for anything such as foot, eye, skin, etc.  please list their name(s) Dr.Cox, Cardiologist in [place], [place]   How confident are you that you can control or manage most of your health problems? Very confident       I have reviewed and updated as appropriate the past medical, family and social history. 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    Chronic obstructive airway disease  Coronary artery disease     Dementia     Elevated PSA     Epileptic seizure (CMS HCC)     Esophageal reflux     Essential hypertension     Hearing loss     Hyperlipidemia     Meniere's disease     PAD (peripheral artery disease) (CMS HCC)      Past Surgical History:   Procedure Laterality Date    Hx coronary artery bypass graft       Current Outpatient Medications   Medication Sig    amLODIPine  (NORVASC) 10 mg Oral Tablet TAKE 1 TABLET ONCE DAILY   FOR HIGH BLOOD PRESSURE    aspirin (ECOTRIN) 81 mg Oral Tablet, Delayed Release (E.C.) Take 1 Tablet (81 mg total) by mouth    cyanocobalamin  (VITAMIN B-12) 1,000 mcg Oral Tablet Take 1 Tablet (1,000 mcg total) by mouth Once a day    donepeziL  (ARICEPT ) 5 mg Oral Tablet Take 1 Tablet (5 mg total) by mouth Every night for 90 days    FLUoxetine  (PROZAC ) 40 mg Oral Capsule TAKE  1 CAPSULE DAILY FOR   ANXIOUSNESS ASSOCIATED WITHDEPRESSION.    Levetiracetam  750 mg Oral Tablet Sustained Release 24 hr Take 1 Tablet (750 mg total) by mouth Twice daily    omeprazole  (PRILOSEC) 20 mg Oral Capsule, Delayed Release(E.C.) Take 1 Capsule (20 mg total) by mouth Once a day    ondansetron  (ZOFRAN  ODT) 4 mg Oral Tablet, Rapid Dissolve Take 1 Tablet (4 mg total) by mouth Every 8 hours as needed for Nausea/Vomiting (Patient not taking: Reported on 10/06/2023)    Perindopril  Erbumine (ACEON ) 4 mg Oral Tablet Take 1 Tablet (4 mg total) by mouth Once a day    rosuvastatin  (CRESTOR ) 5 mg Oral Tablet Take 1 Tablet (5 mg total) by mouth Once a day    tamsulosin  (FLOMAX ) 0.4 mg Oral Capsule Take 1 Capsule (0.4 mg total) by mouth Once a day     Family Medical History:       Problem Relation (Age of Onset)    Alzheimer's/Dementia Mother (58)    Cancer Other    Elevated Lipids Mother    Emphysema Father    Heart Attack Father    Migraines Other    Sleep apnea Other            Social History     Socioeconomic History    Marital status: Married   Tobacco Use    Smoking status: Never    Smokeless tobacco: Never   Vaping Use    Vaping status: Never Used   Substance and Sexual Activity    Alcohol use: Never    Drug use: Never     Social Determinants of Health     Health Literacy: Low Risk  (03/03/2023)    Health Literacy     SDOH Health Literacy: Never         List of Current Health Care Providers   Care Team       PCP       Name Type Specialty Phone Number    Rebbeca Campi, MD Physician [facility] [phone]              Care Team       No care team found                      Health Maintenance   Topic Date Due    Hepatitis C screening  Never done    Adult Tdap-Td (1 - Tdap) Never done    Shingles Vaccine (3 of 3) 11/26/2016    Medicare Annual Wellness Visit  Never done    Covid-19 Vaccine (4 - 2024-25 season) 01/25/2023    Depression Screening  10/05/2024    Influenza Vaccine  Completed    RSV Adult 60+ or  Pregnancy  Completed    Pneumococcal Vaccination, Age 84+  Completed     Medicare Wellness Assessment   Medicare initial or wellness physical in the last year?: No  Advance Directives   Does patient have a living will or MPOA: No           Advance directive information given to the patient today?: Yes      Activities of Daily Living   Do you need help with dressing, bathing, or walking?: No   Do you need help with shopping, housekeeping, medications, or finances?: No (Wife does his medications)   Do you have rugs in hallways, broken steps, or poor lighting?: No   Do you have grab bars in your bathroom, non-slip strips in your tub, and hand rails on your stairs?: No (Does not have a grab bar)   Cognitive Function Screen (1=Yes, 0=No)   What is you age?: Incorrect   What is the time to the nearest hour?: Correct   What is the year?: Correct   What is the name of this clinic?: Incorrect   Can the patient recognize two persons (the doctor, the nurse, home help, etc.)?: Correct   What is the date of your birth? (day and month sufficient) : Correct   In what year did World War II end?: Incorrect   Who is the current president of the [place] ?: Correct   Count from 20 down to 1?: Correct   What address did I give you earlier?: Incorrect   Total Score: 6   Interpretation of Total Score: Greater than 6 Normal   Fall Risk Screen   Do you feel unsteady when standing or walking?: No  Do you worry about falling?: No  Have you fallen in the past year?: No   Depression Screen     Little interest or pleasure in doing things.: Not at all  Feeling down, depressed, or hopeless: Not at all  PHQ 2 Total: 0     Pain Score   Pain Score:   0 - No pain    Substance Use-Abuse Screening     Tobacco Use     In Past 12 MONTHS, how often have you used any tobacco product (for example, cigarettes, e-cigarettes, cigars, pipes, or smokeless tobacco)?: Never     Alcohol use     In the PAST 12 MONTHS, how often have you had 5 (men)/4 (women) or  more drinks containing alcohol in one day?: Never     Prescription Drug Use     In the PAST 12 months, how often have you used any prescription medications just for the feeling, more than prescribed, or that were not prescribed for you? Prescriptions may include: opioids, benzodiazepines, medications for ADHD: Never           Illicit Drug Use   In the PAST 12 MONTHS, how often have you used any drugs, including marijuana, cocaine or crack, heroin, methamphetamine, hallucinogens, ecstasy/MDMA?: Never        Hearing Screen   Have you noticed any hearing difficulties?: Yes  After whispering 9-1-6 how many numbers did the patient  repeat correctly?: 3  After whispering 4-7-8 how many numbers did the patient repeat correctly?: 3       Vision Screen             Urine Incontinence Screen   Urinary Incontinence Screen  Do you ever leak urine when you don't want to?: No                     OBJECTIVE:   BP 131/64 (Site: Left Arm, Patient Position: Sitting, Cuff Size: Adult)   Pulse 60   Temp 36.1 C (97 F) (Temporal)   Resp 18   Ht 1.727 m (5\' 8" )   Wt 83 kg (183 lb)   SpO2 94%   BMI 27.83 kg/m        Other appropriate exam:    Health Maintenance Due   Topic Date Due    Hepatitis C screening  Never done    Adult Tdap-Td (1 - Tdap) Never done    Shingles Vaccine (3 of 3) 11/26/2016    Medicare Annual Wellness Visit  Never done    Covid-19 Vaccine (4 - 2024-25 season) 01/25/2023      ASSESSMENT & PLAN:  Problem List Items Addressed This Visit          Neurologic    Epileptic seizure (CMS HCC) (Chronic)    Dementia (Chronic)     Other Visit Diagnoses         Encounter for hepatitis C screening test for low risk patient    -  Primary    Relevant Orders    HEPATITIS C ANTIBODY SCREEN WITH REFLEX TO HCV PCR             Identified Risk Factors/ Recommended Actions       The PHQ 2 Total: 0 depression screen is interpreted as negative.        Advanced Directives: Patient agreeable to - Advanced Directives discussed and patient  elected to take home to review.        Orders Placed This Encounter    HEPATITIS C ANTIBODY SCREEN WITH REFLEX TO HCV PCR          The patient has been educated about risk factors and recommended preventive care. Written Prevention Plan completed/ updated and given to patient (see After Visit Summary).    Return in about 1 year (around 10/05/2024).    Rebbeca Campi, MD

## 2023-10-06 NOTE — Nursing Note (Signed)
 10/06/23 1000   Medicare Wellness Assessment   Medicare initial or wellness physical in the last year? No   Advance Directives   Does patient have a living will or MPOA No   Advance directive information given to the patient today? Yes   Activities of Daily Living   Do you need help with dressing, bathing, or walking? No   Do you need help with shopping, housekeeping, medications, or finances? No  (Wife does his medications)   Do you have rugs in hallways, broken steps, or poor lighting? No   Do you have grab bars in your bathroom, non-slip strips in your tub, and hand rails on your stairs? No  (Does not have a grab bar)   Cognitive Function Screen   What is you age? 0   What is the time to the nearest hour? 1   Remember this address: [address]   What is the year? 1   What is the name of this clinic? 0   Can the patient recognize two persons (the doctor, the nurse, home help, etc.)? 1   What is the date of your birth? (day and month sufficient)  1   In what year did World War II end? 0   Who is the current president of the [place] ? 1   Count from 20 down to 1? 1   What address did I give you earlier? 0   Total Score 6   Interpretation of Total Score Greater than 6 Normal   Depression Screen   Little interest or pleasure in doing things. 0   Feeling down, depressed, or hopeless 0   PHQ 2 Total 0   Pain Score   Pain Score Zero   Substance Use Screening   In Past 12 MONTHS, how often have you used any tobacco product (for example, cigarettes, e-cigarettes, cigars, pipes, or smokeless tobacco)? Never   In the PAST 12 MONTHS, how often have you had 5 (men)/4 (women) or more drinks containing alcohol in one day? Never   In the PAST 12 months, how often have you used any prescription medications just for the feeling, more than prescribed, or that were not prescribed for you? Prescriptions may include: opioids, benzodiazepines, medications for ADHD Never   In the PAST 12 MONTHS, how often have you  used any drugs, including marijuana, cocaine or crack, heroin, methamphetamine, hallucinogens, ecstasy/MDMA? Never   Hearing Screen   Have you noticed any hearing difficulties? Yes   After whispering 9-1-6 how many numbers did the patient repeat correctly? 3   After whispering 4-7-8 how many numbers did the patient repeat correctly? 3   Total Correct 6   Fall Risk Assessment   Do you feel unsteady when standing or walking? No   Do you worry about falling? No   Have you fallen in the past year? No   Urinary Incontinence Screen   Do you ever leak urine when you don't want to? No

## 2023-10-06 NOTE — Assessment & Plan Note (Addendum)
 Controlled  Amlodipine  10 mg  Aceon   Orders:    COMPREHENSIVE METABOLIC PANEL, NON-FASTING; Future

## 2023-10-06 NOTE — Nursing Note (Signed)
 10/06/23 1610   Domestic Violence   Because we are aware of abuse and domestic violence today, we ask all patients: Are you being hurt, hit, or frightened by anyone at your home or in your life?  N   Basic Needs   Do you have any basic needs within your home that are not being met? (such as Food, Shelter, [employer] fast streamer, Tranportation, paying for bills and/or medications) N

## 2023-10-06 NOTE — Progress Notes (Signed)
 FAMILY MEDICINE, MEDICAL OFFICE BUILDING  [address]  [place] [address]  Operated by [facility]    Jason Jacobs  1945/06/06  [id number]    Date of Service: 10/06/2023 10:00 AM EDT    Chief complaint:   Chief Complaint   Patient presents with    Follow Up     Patient is here for CDM. The wishes to discuss vitamin d .        Subjective:     This is a case of a 78 y.o. year old male who comes in today for CDM.    Recent lab results reviewed and noted.      ROS:  Constitutional - no appetite or weight changes, no fatigue, no fevers, chills, or night sweats  Respiratory - no dyspnea or cough  Cardiovascular - no chest pain or palpitations  Gastrointestinal - no nausea, vomiting, diarrhea, or constipation, no dyspepsia  Endocrine - no heat or cold intolerance, no polyuria, polydipsia, or polyphagia  Skin - no rashes, color changes, or lesions  Musculoskeletal - no arthralgias or myalgias  Genitourinary - no urinary frequency or dysuria, no genital discharge  Neurologic - no vision or hearing changes, no weakness, no parasthesias   Psychiatric - mood has been appropriate, no depression or anxiety    Patient Active Problem List    Diagnosis Date Noted    Nephrolithiasis 07/03/2022    Bilateral hearing loss, unspecified hearing loss type 07/03/2022    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    Essential hypertension 01/30/2022    Esophageal reflux 01/30/2022    Anxiety 01/30/2022    Dementia 01/30/2022    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    Carotid artery stenosis 05/31/2007    Hyperlipidemia 05/31/2007       Past Surgical History:   Procedure Laterality Date    HX CORONARY ARTERY BYPASS GRAFT             Current Outpatient Medications   Medication Sig    amLODIPine  (NORVASC) 10 mg Oral Tablet TAKE 1 TABLET ONCE DAILY   FOR HIGH BLOOD PRESSURE    aspirin (ECOTRIN) 81 mg Oral Tablet, Delayed Release (E.C.) Take 1 Tablet (81 mg total) by mouth    cyanocobalamin  (VITAMIN B-12)  1,000 mcg Oral Tablet Take 1 Tablet (1,000 mcg total) by mouth Once a day    donepeziL  (ARICEPT ) 5 mg Oral Tablet Take 1 Tablet (5 mg total) by mouth Every night for 90 days    FLUoxetine  (PROZAC ) 40 mg Oral Capsule TAKE 1 CAPSULE DAILY FOR   ANXIOUSNESS ASSOCIATED WITHDEPRESSION.    Levetiracetam  750 mg Oral Tablet Sustained Release 24 hr Take 1 Tablet (750 mg total) by mouth Twice daily    omeprazole  (PRILOSEC) 20 mg Oral Capsule, Delayed Release(E.C.) Take 1 Capsule (20 mg total) by mouth Once a day    ondansetron  (ZOFRAN  ODT) 4 mg Oral Tablet, Rapid Dissolve Take 1 Tablet (4 mg total) by mouth Every 8 hours as needed for Nausea/Vomiting (Patient not taking: Reported on 10/06/2023)    Perindopril  Erbumine (ACEON ) 4 mg Oral Tablet Take 1 Tablet (4 mg total) by mouth Once a day    rosuvastatin  (CRESTOR ) 5 mg Oral Tablet Take 1 Tablet (5 mg total) by mouth Once a day    tamsulosin  (FLOMAX ) 0.4 mg Oral Capsule Take 1 Capsule (0.4 mg total) by mouth Once a day       Objective:     BP 131/64 (Site: Left Arm, Patient  Position: Sitting, Cuff Size: Adult)   Pulse 60   Temp 36.6 C (97.9 F) (Temporal)   Resp 18   Ht 1.727 m (5\' 8" )   Wt 83 kg (183 lb)   SpO2 94%   BMI 27.83 kg/m       BMI addressed: Intervention for BMI inappropriate due to age over 24 and comorbidities.     General appearance: alert, cooperative, in no acute distress  HEENT: PERRL; Ears: NL TM,  Throat: non-erythematous w/o lesion, moist mucous membranes; no LAD or thyromegaly, neck supple.  Lungs: clear to auscultation bilaterally; no wheezes or rhonchi   Heart: regular rate and rhythm; normal S1 & S2; no murmur  Abdomen: soft, non-tender, not distended; bowel sounds present.  No palpable masses.  Extremities: extremities normal ROM, no cyanosis or edema , no rash  Vascular: carotid, femoral, pedel pulses are normal w/o bruit  Psych: alert and oriented x 3  Neuro: CN 2-12 grossly intact  Peripheral motor and sensory exams are grossly  normal    Assessment/Plan     Assessment & Plan  Arteriosclerotic cardiovascular disease (ASCVD)  Stable  LDL 38  Rosuvastatin  5mg  daily-  I do not think pt would benefit from higher dose        Essential hypertension  Controlled  Amlodipine  10 mg  Aceon   Orders:    COMPREHENSIVE METABOLIC PANEL, NON-FASTING; Future    Hyperlipidemia    Orders:    LIPID PANEL; Future    Gastroesophageal reflux disease, unspecified whether esophagitis present  Prilosec  Orders:    CBC/DIFF; Future    Vitamin D  deficiency  He previously took it every other week and spouse asks that this be renewed  Orders:    VITAMIN D  25 TOTAL; Future    Vitamin B12 deficiency    Orders:    VITAMIN B12; Future    Epileptic seizure (CMS HCC)  Pt has been taking BID Keppra  for years.  Will ck a level and see what Dr. Reinhold Carbine would like to do.  We do not know why previous neurologist choose this dose.   Orders:    LEVETIRACETAM , SERUM; Future    Dementia  Aricept  was started by Dr. Reinhold Carbine       Carotid artery stenosis  Followed by Dr. Conlee every 6 months                  Health Maintenance:                   The patient was given the opportunity to ask questions and those questions were answered to the patient's satisfaction. The patient was encouraged to call with any additional questions or concerns. Instructed patient to call back if symptoms worse.   Discussed with patient effects and side effects of medications. Medication safety was discussed. A copy of the patient's medication list was printed and given to the patient. A good faith effort was made to reconcile the patient's medications.       Follow up: Return in about 6 months (around 04/07/2024).    Rebbeca Campi, MD

## 2023-10-06 NOTE — Assessment & Plan Note (Addendum)
 Prilosec  Orders:    CBC/DIFF; Future

## 2023-10-06 NOTE — Assessment & Plan Note (Signed)
 Aricept  was started by Dr. Reinhold Carbine

## 2023-10-06 NOTE — Patient Instructions (Signed)
 Update your vaccinations at your pharmacy.  You may be due for a second Shingrix vaccination if you only got one.  You may also be due for a 10 year update to your tetanus vaccination.      It is recommended that you update your covid vaccination this fall.

## 2023-10-06 NOTE — Nursing Note (Signed)
 10/06/23 0955   Comprehensive Health Assessment-Adult   Do you wish to complete this form? Yes   During the past 4 weeks, how would you rate your health in general? Good   During the past 4 weeks, how much difficulty have you had doing your usual activities inside and outside your home because of medical or emotional problems? A little bit of difficulty   During the past 4 weeks, was someone available to help you if you needed and wanted help? Yes, as much as I wanted   In the past year, how many times have you gone to the emergency department or been admitted to a hospital for a health problem? None   Are you generally satisfied with your sleep? Yes   Do you have enough money to buy things you need in everyday life, such as food, clothing, medicines, and housing? Yes, always   Can you get to places beyond walking distance without help?  (For example, can you drive your own car or travel alone on buses)? Yes   Do you fasten your seatbelt when you are in a car? Yes, usually   Do you exercise 20 minutes 3 or more days per week (such as walking, dancing, biking, mowing grass, swimming)? Yes, some of the time   How often do you eat food that is healthy (fruits, vegetables, lean meats) instead of unhealthy (sweets, fast food, junk food, fatty foods)? Some of the time   Have your parents, brothers or sisters had any of the following problems before the age of 85? (check all that apply) Heart problems, or hardening of the arteries;Alcohol or drug addiction (or abuse)   How often do you have trouble taking medicines the eay you are told to take them? I always take them as prescribed   Do you need any help communicating with your doctors and nurses because of vision or hearing problems? No   During the past 12 months, have you experienced confusion or memory loss that is happening more often or is getting worse? Yes   Do you have one person you think of as your personal doctor (primary care provider or family doctor)? Yes    If you are seeing a Primary Care Provider (PCP) or family doctor. please list their name Dr.Marshall   Are you now also seeing any specialist physician(s) (such as eye doctor, foot doctor, skin doctor)? Yes   If you are seeing a specialist for anything such as foot, eye, skin, etc.  please list their name(s) Dr.Cox, Cardiologist in [place], [place]   How confident are you that you can control or manage most of your health problems? Very confident

## 2023-10-06 NOTE — Assessment & Plan Note (Addendum)
 Stable  LDL 38  Rosuvastatin  5mg  daily-  I do not think pt would benefit from higher dose

## 2023-10-06 NOTE — Nursing Note (Signed)
 10/06/23 8469   Fall Risk Assessment   Do you feel unsteady when standing or walking? No   Do you worry about falling? No   Have you fallen in the past year? No

## 2023-10-06 NOTE — Assessment & Plan Note (Addendum)
 Orders:    LIPID PANEL; Future

## 2023-10-06 NOTE — Assessment & Plan Note (Signed)
 Followed by Dr. Conlee every 6 months

## 2023-10-06 NOTE — Assessment & Plan Note (Signed)
 Pt has been taking BID Keppra  for years.  Will ck a level and see what Dr. Reinhold Carbine would like to do.  We do not know why previous neurologist choose this dose.   Orders:    LEVETIRACETAM , SERUM; Future

## 2023-10-07 ENCOUNTER — Other Ambulatory Visit (INDEPENDENT_AMBULATORY_CARE_PROVIDER_SITE_OTHER): Payer: Self-pay | Admitting: Internal Medicine

## 2023-10-07 DIAGNOSIS — E559 Vitamin D deficiency, unspecified: Secondary | ICD-10-CM

## 2023-10-07 LAB — HEPATITIS C ANTIBODY SCREEN WITH REFLEX TO HCV PCR: HCV ANTIBODY QUALITATIVE: NEGATIVE

## 2023-10-07 MED ORDER — CHOLECALCIFEROL (VITAMIN D3) 250 MCG (10,000 UNIT) CAPSULE
10000.0000 [IU] | ORAL_CAPSULE | ORAL | Status: AC
Start: 2023-10-07 — End: ?

## 2023-10-08 NOTE — Assessment & Plan Note (Signed)
 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 Followed by 20 minutes of postictal confusion. There is no clear aura and no motor automatisms during events. 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 He has been on Keppra  for a number of years now with no further episodes. He and wife believe there have only been 4-5 events total. He is currently prescribed 750mg  sustained release BID which is not clearly indicated in prior neurology notes. This would be equivalent of 1500mg  BID. I have talked with PCP who has been filling this and reports that she refilled what patient came to her on. I'm not sure he needs this dose. Regardless, he is tolerating well so will continue for the time being.     - continue Keppra  SR 750mg  BID (would consider weaning to 1000 mg IR BID)    RTC 3 months

## 2023-10-12 LAB — LEVETIRACETAM, SERUM: LEVETIRACETAM: 24.6 ug/mL

## 2023-10-28 ENCOUNTER — Other Ambulatory Visit (INDEPENDENT_AMBULATORY_CARE_PROVIDER_SITE_OTHER): Payer: Self-pay | Admitting: Physician Assistant

## 2023-11-11 ENCOUNTER — Other Ambulatory Visit (INDEPENDENT_AMBULATORY_CARE_PROVIDER_SITE_OTHER): Payer: Self-pay | Admitting: Internal Medicine

## 2023-11-11 DIAGNOSIS — G40909 Epilepsy, unspecified, not intractable, without status epilepticus: Secondary | ICD-10-CM

## 2023-11-11 MED ORDER — LEVETIRACETAM ER 750 MG TABLET,EXTENDED RELEASE 24 HR
1.0000 | ORAL_TABLET | Freq: Two times a day (BID) | ORAL | 0 refills | Status: DC
Start: 2023-11-11 — End: 2024-02-15

## 2023-11-18 ENCOUNTER — Other Ambulatory Visit (INDEPENDENT_AMBULATORY_CARE_PROVIDER_SITE_OTHER): Payer: Self-pay | Admitting: Internal Medicine

## 2023-12-22 ENCOUNTER — Other Ambulatory Visit (INDEPENDENT_AMBULATORY_CARE_PROVIDER_SITE_OTHER): Payer: Self-pay | Admitting: Internal Medicine

## 2023-12-22 DIAGNOSIS — K219 Gastro-esophageal reflux disease without esophagitis: Secondary | ICD-10-CM

## 2023-12-23 ENCOUNTER — Other Ambulatory Visit (INDEPENDENT_AMBULATORY_CARE_PROVIDER_SITE_OTHER): Payer: Self-pay | Admitting: NEUROLOGY

## 2023-12-31 ENCOUNTER — Encounter (INDEPENDENT_AMBULATORY_CARE_PROVIDER_SITE_OTHER): Payer: Self-pay | Admitting: NEUROLOGY

## 2023-12-31 ENCOUNTER — Other Ambulatory Visit: Payer: Self-pay

## 2023-12-31 ENCOUNTER — Ambulatory Visit (INDEPENDENT_AMBULATORY_CARE_PROVIDER_SITE_OTHER): Payer: Self-pay | Admitting: NEUROLOGY

## 2023-12-31 VITALS — BP 148/59 | HR 58 | Temp 97.3°F

## 2023-12-31 DIAGNOSIS — R4189 Other symptoms and signs involving cognitive functions and awareness: Secondary | ICD-10-CM

## 2023-12-31 DIAGNOSIS — G40909 Epilepsy, unspecified, not intractable, without status epilepticus: Secondary | ICD-10-CM

## 2023-12-31 NOTE — Assessment & Plan Note (Signed)
 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 Followed by 20 minutes of postictal confusion. There is no clear aura and no motor automatisms during events. 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 He has been on Keppra  for a number of years now with no further episodes. He and wife believe there have only been 4-5 events total. He is currently prescribed 750mg  sustained release BID which is not clearly indicated in prior neurology notes. This would be equivalent of 1500mg  BID. I have talked with PCP who has been filling this and reports that she refilled what patient came to her on. I'm not sure he needs this dose. Regardless, he is tolerating well so will continue for the time being.      - continue Keppra  SR 750mg  BID (would consider weaning to 1000 mg IR BID)

## 2023-12-31 NOTE — Progress Notes (Unsigned)
 Assessment & Plan  Cognitive decline  He is a 78 year old man who is referred for longstanding history of cognitive decline as well as seizures. Initial MOCA testing revealed a score of 20/30. DSRS was 9. He has been having cognitive issues for a number of years since he was seeing his previous neurologist. He is having some difficulty with short term recall and has some issues with directions, however, wife still feels very comfortable with his ability to drive. Recent MRI was unremarkable per read. We discussed options for further workup including amyloid PET, lumbar puncture and neuropsych testing. We discussed the roles of these tests as well as new amyloid targeting therapies in Alzheimer's pathology. They would prefer to forego aggressive workup for the time being. We will go ahead and start donepezil  and monitor response.    - counseled on therapeutics and options for further workup  - start donepezil  5mg  QHS  - counseled on diet and lifestyle modifications known to be beneficial  Epileptic seizure (CMS HCC)  These are characterized by behavioral arrest lasting 2-3 minutes. Followed by 20 minutes of postictal confusion. There is no clear aura and no motor automatisms during events. 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 He has been on Keppra  for a number of years now with no further episodes. He and wife believe there have only been 4-5 events total. He is currently prescribed 750mg  sustained release BID which is not clearly indicated in prior neurology notes. This would be equivalent of 1500mg  BID. I have talked with PCP who has been filling this and reports that she refilled what patient came to her on. I'm not sure he needs this dose. Regardless, he is tolerating well so will continue for the time being.      - continue Keppra  SR 750mg  BID (would consider weaning to 1000 mg IR BID)    Thank you for allowing me to participate in your patient's care and please do not hesitate to  contact me for any questions or concerns.    S. Zach Tequia Wolman, DO  Assistant Professor of Neurology  [place]  [facility]     I personally spent a total of 45 minutes today preparing to see the patient, in the encounter with the patient, and documenting after the visit.    [id number]: I will continue to be the provider focal point in managing the chronic complex neurological condition    ==========================================================================================================================================    NAME:  Jason Jacobs  DOB:  11-15-1945  VISIT DATE:  10/01/2023    CC:  seizure/cognitive decline    Patient seen in consultation at the request of Dr. Calhoun Na  History obtained from the patient and chart/records  Age of patient:  78 y.o.      HPI:   I had the pleasure of seeing your patient in neurology clinic for an outpatient consultation, who is a 78 y.o. year old male who was referred for evaluation of seizure and cognitive decline.  Please allow me to summarize the history for the record.    He is joined by wife who gives additional details. He was previously seeing Dr. Ardis. In early 2000s, he began having occasional episodes of behavioral arrest. Wife states he would stare and not respond for up to 2-3 minutes. He denies ever having a preceding aura aside from once when he experienced numbness around his head prior to onset. He was amnestic to these events. No automatisms that wife ever saw. They  were occurring maybe once every 6 months. He reportedly had an abnormal EEG and Dr. Ardis started him on Keppra . He has been seizure free seince that time (2013).   He also thinks he has been having mild cognitive issues since around that time too. They have become a little more noticeable. His wife reports that he has trouble with directions in familiar areas though reports his driving is still safe. He forgets recent conversations. He denies any AV  hallucinations. No changes to gait.     ============================================================================================================================================  PMHx  Patient Active Problem List   Diagnosis    Arteriosclerotic cardiovascular disease (ASCVD)    Carotid artery stenosis    Hyperlipidemia    Epileptic seizure (CMS HCC)    Essential hypertension    Esophageal reflux    Anxiety    Dementia    Nephrolithiasis    Bilateral hearing loss, unspecified hearing loss type     Past Surgical History:   Procedure Laterality Date    HX CORONARY ARTERY BYPASS GRAFT           Family Medical History:       Problem Relation (Age of Onset)    Alzheimer's/Dementia Mother (63)    Cancer Other    Elevated Lipids Mother    Emphysema Father    Heart Attack Father    Migraines Other    Sleep apnea Other            Current Outpatient Medications   Medication Sig Dispense Refill    amLODIPine  (NORVASC) 10 mg Oral Tablet TAKE 1 TABLET ONCE DAILY   FOR HIGH BLOOD PRESSURE 90 Tablet 3    aspirin (ECOTRIN) 81 mg Oral Tablet, Delayed Release (E.C.) Take 1 Tablet (81 mg total) by mouth      cholecalciferol , vitamin D3, 250 mcg (10,000 unit) Oral Capsule Take 1 Capsule (10,000 Units total) by mouth Every 7 days      cyanocobalamin  (VITAMIN B-12) 1,000 mcg Oral Tablet Take 1 Tablet (1,000 mcg total) by mouth Once a day      donepeziL  (ARICEPT ) 5 mg Oral Tablet TAKE 1 TABLET BY MOUTH EVERY NIGHT 90 Tablet 0    FLUoxetine  (PROZAC ) 40 mg Oral Capsule TAKE 1 CAPSULE DAILY FOR   ANXIOUSNESS ASSOCIATED WITHDEPRESSION. 90 Capsule 0    Levetiracetam  750 mg Oral Tablet Sustained Release 24 hr Take 1 Tablet (750 mg total) by mouth Twice daily 180 Tablet 0    omeprazole  (PRILOSEC) 20 mg Oral Capsule, Delayed Release(E.C.) TAKE 1 CAPSULE ONCE DAILY 90 Capsule 1    ondansetron  (ZOFRAN  ODT) 4 mg Oral Tablet, Rapid Dissolve Take 1 Tablet (4 mg total) by mouth Every 8 hours as needed for Nausea/Vomiting (Patient not taking: Reported  on 10/06/2023) 12 Tablet 0    Perindopril  Erbumine (ACEON ) 4 mg Oral Tablet Take 1 Tablet (4 mg total) by mouth Once a day 90 Tablet 3    rosuvastatin  (CRESTOR ) 5 mg Oral Tablet Take 1 Tablet (5 mg total) by mouth Once a day 90 Tablet 3    tamsulosin  (FLOMAX ) 0.4 mg Oral Capsule TAKE 1 CAPSULE ONCE DAILY 90 Capsule 3     No current facility-administered medications for this visit.     No Known Allergies  Social History     Socioeconomic History    Marital status: Married     Spouse name: Not on file    Number of children: Not on file    Years of education: Not on file  Highest education level: Not on file   Occupational History    Not on file   Tobacco Use    Smoking status: Never    Smokeless tobacco: Never   Vaping Use    Vaping status: Never Used   Substance and Sexual Activity    Alcohol use: Never    Drug use: Never    Sexual activity: Not on file   Other Topics Concern    Not on file   Social History Narrative    Not on file     Social Determinants of Health     Financial Resource Strain: Not on file   Transportation Needs: Not on file   Social Connections: Not on file   Intimate Partner Violence: Not on file   Housing Stability: Not on file       ============================================================================================================================================  GENERAL EXAMINATION  There were no vitals taken for this visit.    Vital signs personally reviewed  General: No acute distress, alert  HEENT: Normocephalic, no scleral icterus  Extremities: No significant edema, No cyanosis    NEUROLOGIC EXAM  On neurological exam, patient was awake, alert and answering questions appropriately  Speech was fluent, without dysarthria or aphasia.    CN  II: not directly tested, grossly intact  III, IV, VI: extraocular movements intact without nystagmus  V: intact to light touch  VII: face symmetric without weakness  VIII: grossly intact  IX, X: symmetric palatal elevation  XI: normal strength of  trapezius and sternocleidomastoid bilaterally  XII: tongue midline with full movements    MOTOR  Bulk: normal  Tone: normal  Abnormal Movements: none    Strength:     MRC Grading Scale   Right Left   Deltoid 5 5   Biceps 5 5   Triceps 5 5   Wrist Extension 5 5   Wrist Flexion - -   Finger Extension 5 5   Finger Abduction 5 5   Finger Flexion 5 5   Hip Flexion 5 5   Hip Extension - -   Hip Abduction - -   Hip Adduction - -   Knee Extension 5 5   Knee Flexion 5 5   Ankle Dorsiflexion - -   Ankle Plantarflexion - -   Toe Extension - -   Toe Flexion - -     REFLEXES   Right Left   Biceps 2 2   Triceps 2 2   Brachioradialis 2 2   Patellar 2 2   Achilles - -   Plantar - -   Hoffman - -   Pectoralis - -   Jaw Jerk - -       SENSORY  Light touch: intact throughout    GAIT  General: casual, normal gait    COORDINATION  Finger nose finger: normal  ================================================================================================================================LABS  Personal Review of prior labs is notable for:   2024  TSH WNL   B12 244  CMP mildly elevated creatinine  CBC largely WNL   IMAGING  Personal Review of imaging is notable for:   MRI Brain October 2024  Largely unremarkable for age per read  OTHER DIAGNOSTICS  Personal Review of other prior diagnostics is notable for: not applicable

## 2024-01-09 ENCOUNTER — Other Ambulatory Visit (INDEPENDENT_AMBULATORY_CARE_PROVIDER_SITE_OTHER): Payer: Self-pay | Admitting: Internal Medicine

## 2024-01-09 DIAGNOSIS — E785 Hyperlipidemia, unspecified: Secondary | ICD-10-CM

## 2024-01-11 ENCOUNTER — Other Ambulatory Visit (INDEPENDENT_AMBULATORY_CARE_PROVIDER_SITE_OTHER): Payer: Self-pay | Admitting: Physician Assistant

## 2024-01-11 DIAGNOSIS — Z125 Encounter for screening for malignant neoplasm of prostate: Secondary | ICD-10-CM

## 2024-01-11 DIAGNOSIS — R3912 Poor urinary stream: Secondary | ICD-10-CM

## 2024-01-12 ENCOUNTER — Other Ambulatory Visit: Payer: Self-pay

## 2024-01-12 ENCOUNTER — Ambulatory Visit (INDEPENDENT_AMBULATORY_CARE_PROVIDER_SITE_OTHER): Payer: Self-pay | Admitting: Physician Assistant

## 2024-01-12 VITALS — BP 127/59 | HR 62 | Ht 67.0 in | Wt 178.0 lb

## 2024-01-12 DIAGNOSIS — N400 Enlarged prostate without lower urinary tract symptoms: Secondary | ICD-10-CM

## 2024-01-12 DIAGNOSIS — Z9889 Other specified postprocedural states: Secondary | ICD-10-CM

## 2024-01-12 DIAGNOSIS — N401 Enlarged prostate with lower urinary tract symptoms: Secondary | ICD-10-CM

## 2024-01-12 DIAGNOSIS — Z87442 Personal history of urinary calculi: Secondary | ICD-10-CM

## 2024-01-12 MED ORDER — TAMSULOSIN 0.4 MG CAPSULE
0.4000 mg | ORAL_CAPSULE | Freq: Every day | ORAL | 3 refills | Status: AC
Start: 2024-01-12 — End: ?

## 2024-01-12 NOTE — Progress Notes (Unsigned)
 [facility]  UROLOGY, [facility]    Progress Note    Name: Jason Jacobs MRN:  [id number]   Date: 01/12/2024 Age: 78 y.o.       Chief Complaint: Benign Prostatic Hypertrophy and Kidney Stones    Subjective:   Patient is a 78 yo male whom presents to the clinic for ureterolithiasis  on CT Abdomen/Pelvis 07/03/22. Patient denies any pain since last visit. Patient started taking Flomax  at last visit and report strong urine stream without dribbling or hesitancy..History of renal calculus with lithotripsy. Patient denies history of tobacco use. Patient denies personal or family history of urinary malignancy. Patient denies occupational chemical exposure.Patient denies fevers, chills, nausea, vomiting, hematuria, dysuria, flank pain, incontinence, dribbling, hesitancy, suprapubic pain, headaches, vision changes, shortness of breath, chest pain.    No complaints or changs      BP (!) 127/59 (Site: Right Arm, Patient Position: Sitting, Cuff Size: Adult)   Pulse 62   Ht 1.702 m (5' 7)   Wt 80.7 kg (178 lb)   BMI 27.88 kg/m       Gen: NAD, alert  Pulm: unlabored at rest  CV: palpable pulses  Abd: soft, Nt/ND  GU: no suprapubic tenderness, no CVAT   Data reviewed:  PROSTATE SPECIFIC ANTIGEN   Lab Results   Component Value Date/Time    PROSSPECAG 0.59 07/14/2022 04:07 PM             Current Outpatient Medications   Medication Sig    amLODIPine  (NORVASC) 10 mg Oral Tablet TAKE 1 TABLET ONCE DAILY   FOR HIGH BLOOD PRESSURE    aspirin (ECOTRIN) 81 mg Oral Tablet, Delayed Release (E.C.) Take 1 Tablet (81 mg total) by mouth    cholecalciferol , vitamin D3, 250 mcg (10,000 unit) Oral Capsule Take 1 Capsule (10,000 Units total) by mouth Every 7 days    cyanocobalamin  (VITAMIN B-12) 1,000 mcg Oral Tablet Take 1 Tablet (1,000 mcg total) by mouth Once a day    donepeziL  (ARICEPT ) 5 mg Oral Tablet TAKE 1 TABLET BY MOUTH EVERY NIGHT    FLUoxetine  (PROZAC ) 40 mg Oral Capsule TAKE 1 CAPSULE DAILY FOR   ANXIOUSNESS ASSOCIATED  WITHDEPRESSION.    Levetiracetam  750 mg Oral Tablet Sustained Release 24 hr Take 1 Tablet (750 mg total) by mouth Twice daily    omeprazole  (PRILOSEC) 20 mg Oral Capsule, Delayed Release(E.C.) TAKE 1 CAPSULE ONCE DAILY    ondansetron  (ZOFRAN  ODT) 4 mg Oral Tablet, Rapid Dissolve Take 1 Tablet (4 mg total) by mouth Every 8 hours as needed for Nausea/Vomiting    Perindopril  Erbumine (ACEON ) 4 mg Oral Tablet Take 1 Tablet (4 mg total) by mouth Once a day    rosuvastatin  (CRESTOR ) 5 mg Oral Tablet TAKE 1 TABLET ONCE DAILY    tamsulosin  (FLOMAX ) 0.4 mg Oral Capsule Take 1 Capsule (0.4 mg total) by mouth Daily     Assessment/Plan  Problem List Items Addressed This Visit    None      BPH/LUTS  Discussed the differential diagnosis, pathophysiology and nature of benign prostate enlargement causing lower urinary tract symptoms  Counseled patient on conservative management options including appropriate fluid management, avoidance of diuretics including caffeine and alcohol   Refill provided for Tamsulosin  Hydrochloride (FlomaxT) 0.4 mg P.O. daily:          History of Nephrolithiasis  I discussed the differential diagnosis, pathophysiology and nature of asymptomatic non-obstructing renal stones as well as the natural history  Increase fluid intake  to achieve urine output of at least 2.5 liters daily  Limit sodium intake to no more than 100 mEq (2,300 mg) per day  Consume 1,000-1,200 mg of dietary calcium per day  Limit oxalate-rich foods (beets, spinach, rhubarb, nuts, chocolate)  Limit non-dairy animal protein  Encouraged increased fruit and vegetable intake      Prostate Cancer Screening  Based on patient's clinical findings including his recent prostate specific antigen level, age, ethnicity and relevant risk factors and in accordance with the American Urological Association (AUA) & National Comprehensive Cancer Network (NCCN) published screening guidelines, I would recommend the following:  Continued annual prostate  specific antigen & digital rectal exam screening with cessation of screening at age 21 or upon developing more serious health issues.  PSA yearly        Areal Cochrane, PA-C

## 2024-01-15 ENCOUNTER — Other Ambulatory Visit: Payer: Self-pay

## 2024-01-15 ENCOUNTER — Ambulatory Visit: Attending: Internal Medicine

## 2024-01-15 DIAGNOSIS — N400 Enlarged prostate without lower urinary tract symptoms: Secondary | ICD-10-CM | POA: Insufficient documentation

## 2024-01-16 LAB — PSA DIAGNOSTIC WITH FREE PSA REFLEX: PSA: 0.73 ng/mL (ref <=4.00)

## 2024-01-28 ENCOUNTER — Other Ambulatory Visit (INDEPENDENT_AMBULATORY_CARE_PROVIDER_SITE_OTHER): Payer: Self-pay | Admitting: Internal Medicine

## 2024-01-28 DIAGNOSIS — I10 Essential (primary) hypertension: Secondary | ICD-10-CM

## 2024-01-28 DIAGNOSIS — F419 Anxiety disorder, unspecified: Secondary | ICD-10-CM

## 2024-02-01 ENCOUNTER — Other Ambulatory Visit (INDEPENDENT_AMBULATORY_CARE_PROVIDER_SITE_OTHER): Payer: Self-pay | Admitting: NEUROLOGY

## 2024-02-01 MED ORDER — DONEPEZIL 5 MG TABLET
5.0000 mg | ORAL_TABLET | Freq: Every evening | ORAL | 0 refills | Status: DC
Start: 2024-02-01 — End: 2024-05-01

## 2024-02-01 NOTE — Telephone Encounter (Signed)
 Wife called and said Dr. Sherre had told them to call when it was time for a refill on Aricept . She said Dr. Sherre wanted to know if patient wants to increase the Aricept  to 10 mg or keep it at 5 mg. Wife stated that they want to continue on the 5 mg and he needs a refill.

## 2024-02-02 ENCOUNTER — Telehealth (INDEPENDENT_AMBULATORY_CARE_PROVIDER_SITE_OTHER): Payer: Self-pay | Admitting: NEUROLOGY

## 2024-02-02 MED ORDER — DONEPEZIL 10 MG TABLET: 10.0000 mg | ORAL_TABLET | Freq: Every evening | ORAL | 0 refills | Status: DC

## 2024-02-02 NOTE — Telephone Encounter (Signed)
 Patient's wife called regarding the patient's Aricept . Dr. Sherre sent in a refill on 9/8 for the 5 mg tablets. She said the pharmacy will not fill that until 10/24 because patient was doing a trial on the 10 mg and had been taking two of the 5 mg. Wife stated patient has enough for this week and next, but then he will be out.

## 2024-02-14 ENCOUNTER — Other Ambulatory Visit (INDEPENDENT_AMBULATORY_CARE_PROVIDER_SITE_OTHER): Payer: Self-pay | Admitting: Internal Medicine

## 2024-02-14 DIAGNOSIS — G40909 Epilepsy, unspecified, not intractable, without status epilepticus: Secondary | ICD-10-CM

## 2024-04-01 ENCOUNTER — Other Ambulatory Visit: Payer: Self-pay

## 2024-04-01 ENCOUNTER — Ambulatory Visit: Attending: Internal Medicine

## 2024-04-01 DIAGNOSIS — Z23 Encounter for immunization: Secondary | ICD-10-CM | POA: Insufficient documentation

## 2024-04-07 ENCOUNTER — Other Ambulatory Visit: Payer: Self-pay

## 2024-04-07 ENCOUNTER — Ambulatory Visit: Payer: Self-pay | Attending: Internal Medicine | Admitting: Internal Medicine

## 2024-04-07 ENCOUNTER — Encounter (INDEPENDENT_AMBULATORY_CARE_PROVIDER_SITE_OTHER): Payer: Self-pay | Admitting: Internal Medicine

## 2024-04-07 VITALS — BP 131/55 | HR 57 | Temp 97.4°F | Resp 20 | Ht 67.0 in | Wt 180.4 lb

## 2024-04-07 DIAGNOSIS — F419 Anxiety disorder, unspecified: Secondary | ICD-10-CM | POA: Insufficient documentation

## 2024-04-07 DIAGNOSIS — I6521 Occlusion and stenosis of right carotid artery: Secondary | ICD-10-CM | POA: Insufficient documentation

## 2024-04-07 DIAGNOSIS — I10 Essential (primary) hypertension: Secondary | ICD-10-CM | POA: Insufficient documentation

## 2024-04-07 DIAGNOSIS — F0390 Unspecified dementia without behavioral disturbance: Secondary | ICD-10-CM | POA: Insufficient documentation

## 2024-04-07 MED ORDER — FLUOXETINE 40 MG CAPSULE
40.0000 mg | ORAL_CAPSULE | Freq: Every day | ORAL | 3 refills | Status: AC
Start: 2024-04-07 — End: ?

## 2024-04-07 NOTE — Nursing Note (Signed)
 04/07/24 1302   Fall Risk Assessment   Do you feel unsteady when standing or walking? No   Do you worry about falling? No   Have you fallen in the past year? No

## 2024-04-07 NOTE — Assessment & Plan Note (Signed)
 Aricept  5mg  nightly  Follow up with [facility]

## 2024-04-07 NOTE — Assessment & Plan Note (Addendum)
 Spouse feels that he needs to cont. Same dose  Orders:    FLUoxetine  (PROZAC ) 40 mg Oral Capsule; Take 1 Capsule (40 mg total) by mouth Daily

## 2024-04-07 NOTE — Nursing Note (Signed)
 04/07/24 1302   Recent Weight Change   Have you had a recent unexplained weight loss or gain? N   Health Education and Literacy   How often do you have a problem understanding what is told to you about your medical condition?  Never   Domestic Violence   Because we are aware of abuse and domestic violence today, we ask all patients: Are you being hurt, hit, or frightened by anyone at your home or in your life?  N   Basic Needs   Do you have any basic needs within your home that are not being met? (such as Food, Shelter, [employer], Tranportation, paying for bills and/or medications) N   Advanced Directives   Do you have any advanced directives? No Advance   Would you like an advanced directive packet? Refused Packet

## 2024-04-07 NOTE — Assessment & Plan Note (Signed)
 Recent f/u at [facility]  50-69% Rt carotid stenosis.  Lft carotid s/p CEA 2017

## 2024-04-07 NOTE — Assessment & Plan Note (Signed)
 Controlled  Amlodipine  10 mg  Aceon 

## 2024-04-07 NOTE — Progress Notes (Signed)
 [facility], MED[facility] OFFICE [facility]  [address]  [place] [address]  Operated by [facility]    Jason Jacobs  1945-09-30  [id number]    Date of Service: 04/07/2024  1:00 PM EST    Chief complaint:   Chief Complaint   Patient presents with    Follow Up       Subjective:     This is a case of a 78 y.o. year old male who comes in today for cdm.    He has no complaints.  Pt and spouse feel that he is doing well.     He has followed up with vascular surgery and there was no change in the 50-69% Rt carotid stenosis.    He has followed up with urology.  He has not passed a kidney stone in years.    He has an upcoming appt w/ neuro.      Recent lab results reviewed and noted.  COMPLETE BLOOD COUNT   Lab Results   Component Value Date    WBC 7.6 10/06/2023    HGB 14.5 10/06/2023    HCT 42.4 10/06/2023    PLTCNT 224 10/06/2023       DIFFERENTIAL  Lab Results   Component Value Date    PMNS 52 10/06/2023    LYMPHOCYTES 33 10/06/2023    MONOCYTES 12 10/06/2023    EOSINOPHIL 3 10/06/2023    BASOPHILS 1 10/06/2023    BASOPHILS 0.00 10/06/2023    PMNABS 3.90 10/06/2023    LYMPHSABS 2.50 10/06/2023    EOSABS 0.30 10/06/2023    MONOSABS 0.90 10/06/2023     COMPREHENSIVE METABOLIC PANEL FASTING  Lab Results   Component Value Date    SODIUM 136 10/06/2023    POTASSIUM 4.0 10/06/2023    CHLORIDE 106 10/06/2023    CO2 26 10/06/2023    ANIONGAP 4 10/06/2023    BUN 20 10/06/2023    CREATININE 1.08 10/06/2023    GLUCOSE Negative 07/03/2022    CALCIUM 9.3 10/06/2023    ALBUMIN 4.3 10/06/2023    TOTALPROTEIN 7.2 10/06/2023    ALKPHOS 69 10/06/2023    AST 26 10/06/2023    ALT 32 10/06/2023     VITAMIN B12  Lab Results   Component Value Date    VITB12 695 10/06/2023     VITAMIN D  25-HYDROXY  Lab Results   Component Value Date    VITD 66 07/03/2022       ROS:  Constitutional - no appetite or weight changes, no fatigue, no fevers, chills, or night sweats  Respiratory - no dyspnea or cough  Cardiovascular - no chest  pain or palpitations  Gastrointestinal - no nausea, vomiting, diarrhea, or constipation, no dyspepsia  Endocrine - no heat or cold intolerance, no polyuria, polydipsia, or polyphagia  Skin - no rashes, color changes, or lesions  Musculoskeletal - no arthralgias or myalgias  Genitourinary - no urinary frequency or dysuria, no genital discharge  Neurologic - no vision or hearing changes, no weakness, no parasthesias   Psychiatric - mood has been appropriate, no depression or anxiety    Patient Active Problem List    Diagnosis Date Noted    Nephrolithiasis 07/03/2022    Bilateral hearing loss, unspecified hearing loss type 07/03/2022    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    Essential hypertension 01/30/2022    Esophageal reflux 01/30/2022    Anxiety 01/30/2022    Dementia 01/30/2022    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  Carotid artery stenosis 05/31/2007    Hyperlipidemia 05/31/2007       Past Surgical History:   Procedure Laterality Date    HX CORONARY ARTERY BYPASS GRAFT             Current Outpatient Medications   Medication Sig    amLODIPine  (NORVASC) 10 mg Oral Tablet TAKE 1 TABLET ONCE DAILY   FOR HIGH BLOOD PRESSURE    aspirin (ECOTRIN) 81 mg Oral Tablet, Delayed Release (E.C.) Take 1 Tablet (81 mg total) by mouth    cholecalciferol , vitamin D3, 250 mcg (10,000 unit) Oral Capsule Take 1 Capsule (10,000 Units total) by mouth Every 7 days    cyanocobalamin  (VITAMIN B-12) 1,000 mcg Oral Tablet Take 1 Tablet (1,000 mcg total) by mouth Once a day    donepeziL  (ARICEPT ) 10 mg Oral Tablet Take 1 Tablet (10 mg total) by mouth Every night for 90 days (Patient taking differently: Take 1 Tablet (10 mg total) by mouth 1/2 TABLET AT NIGHT)    FLUoxetine  (PROZAC ) 40 mg Oral Capsule Take 1 Capsule (40 mg total) by mouth Daily    Levetiracetam  750 mg Oral Tablet Sustained Release 24 hr TAKE 1 TABLET (750 MG TOTAL) BY MOUTH TWICE DAILY    omeprazole  (PRILOSEC) 20 mg Oral Capsule, Delayed  Release(E.C.) TAKE 1 CAPSULE ONCE DAILY    ondansetron  (ZOFRAN  ODT) 4 mg Oral Tablet, Rapid Dissolve Take 1 Tablet (4 mg total) by mouth Every 8 hours as needed for Nausea/Vomiting    Perindopril  Erbumine (ACEON ) 4 mg Oral Tablet TAKE 1 TABLET DAILY    rosuvastatin  (CRESTOR ) 5 mg Oral Tablet TAKE 1 TABLET ONCE DAILY    tamsulosin  (FLOMAX ) 0.4 mg Oral Capsule Take 1 Capsule (0.4 mg total) by mouth Daily       Objective:     BP (!) 131/55 (Site: Left Arm, Patient Position: Sitting, Cuff Size: Adult)   Pulse 57   Temp 36.3 C (97.4 F) (Tympanic)   Resp 20   Ht 1.702 m (5' 7)   Wt 81.8 kg (180 lb 6.4 oz)   SpO2 98%   BMI 28.25 kg/m      General appearance: alert, cooperative, in no acute distress  Lungs: clear to auscultation bilaterally; no wheezes or rhonchi   Heart: regular rate and rhythm; normal S1 & S2; no murmur  Extremities: extremities normal ROM, no cyanosis or edema , no rash  Psych: alert and oriented x 3  Neuro: CN 2-12 grossly intact  Peripheral motor and sensory exams are grossly normal    Assessment/Plan     Assessment & Plan  Anxiety  Spouse feels that he needs to cont. Same dose  Orders:    FLUoxetine  (PROZAC ) 40 mg Oral Capsule; Take 1 Capsule (40 mg total) by mouth Daily    Dementia  Aricept  5mg  nightly  Follow up with [facility]          Essential hypertension  Controlled  Amlodipine  10 mg  Aceon           Stenosis of right carotid artery  Recent f/u at [facility]  50-69% Rt carotid stenosis.  Lft carotid s/p CEA 2017                  Health Maintenance:     He is UTD other than shingles and covid vaccinations which they do not desire to take.               The patient was given the opportunity to  ask questions and those questions were answered to the patient's satisfaction. The patient was encouraged to call with any additional questions or concerns. Instructed patient to call back if symptoms worse.   Discussed with patient effects and side effects of medications. Medication safety was discussed.  A copy of the patient's medication list was printed and given to the patient. A good faith effort was made to reconcile the patient's medications.       Follow up: Return in about 6 months (around 10/05/2024).    Calhoun LITTIE Na, MD

## 2024-04-25 ENCOUNTER — Ambulatory Visit (INDEPENDENT_AMBULATORY_CARE_PROVIDER_SITE_OTHER): Payer: Self-pay | Admitting: NEUROLOGY

## 2024-04-25 ENCOUNTER — Other Ambulatory Visit: Payer: Self-pay

## 2024-04-25 ENCOUNTER — Encounter (INDEPENDENT_AMBULATORY_CARE_PROVIDER_SITE_OTHER): Payer: Self-pay | Admitting: NEUROLOGY

## 2024-04-25 VITALS — BP 122/61 | HR 64 | Temp 97.1°F | Wt 181.0 lb

## 2024-04-25 DIAGNOSIS — R4189 Other symptoms and signs involving cognitive functions and awareness: Secondary | ICD-10-CM

## 2024-04-25 DIAGNOSIS — G40909 Epilepsy, unspecified, not intractable, without status epilepticus: Secondary | ICD-10-CM

## 2024-04-25 MED ORDER — LEVETIRACETAM ER 500 MG TABLET,EXTENDED RELEASE 24 HR: 500.0000 mg | ORAL_TABLET | Freq: Two times a day (BID) | ORAL | 0 refills | Status: AC

## 2024-04-25 NOTE — Progress Notes (Signed)
 Assessment & Plan  Cognitive decline  He is a 78 year old man who is referred for longstanding history of cognitive decline as well as seizures. Initial MOCA testing revealed a score of 20/30. DSRS was 9. He has been having cognitive issues for a number of years since he was seeing his previous neurologist. He is having some difficulty with short term recall and has some issues with directions, however, wife still feels very comfortable with his ability to drive. Recent MRI was unremarkable per read. We discussed options for further workup including amyloid PET, lumbar puncture and neuropsych testing. We discussed the roles of these tests as well as new amyloid targeting therapies in Alzheimer's pathology. They would prefer to forego aggressive workup for the time being. Since last visit, they continue not to experience any benefit from donepezil  so we will discontinue at this point. Will consider addition of memantine at next visit.     - discontinue donepezil   - ok to continue driving with supervision  - consider memantine in future  Epileptic seizure (CMS HCC)  These are characterized by behavioral arrest lasting 2-3 minutes. Followed by 20 minutes of postictal confusion. There is no clear aura and no motor automatisms during events. 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 He has been on Keppra  for a number of years now with no further episodes. He and wife believe there have only been 4-5 events total. He is currently prescribed 750mg  sustained release BID which is not clearly indicated in prior neurology notes. This would be equivalent of 1500mg  BID. I have talked with PCP who has been filling this and reports that she refilled what patient came to her on. I'm not sure he needs this dose. As he has had longstanding seizure freedom, we will decrease dose to 500 mg SR.     - continue Keppra  SR 500mg  BID (could consider once daily dosing if tolerating)    RTC 2 months    Thank you for  allowing me to participate in your patient's care and please do not hesitate to contact me for any questions or concerns.    CANDIE Darlyn Free, DO  Assistant Professor of Neurology  [place]  [facility]     [phone]: I will continue to be the provider focal point in managing the chronic complex neurological condition    ==========================================================================================================================================    NAME:  Jason Jacobs  DOB:  1946/04/04  VISIT DATE:  10/01/2023    CC:  seizure/cognitive decline    Patient seen in consultation at the request of Dr. Calhoun Na  History obtained from the patient and chart/records  Age of patient:  78 y.o.    INTERVAL: Since last visit, getting days mixed up. Otherwise has not had much decline in terms of cognition. Wife still believes he is safe driving with supervision. Neither of them noticed any improvement with donepezil . Currently taking 5mg  dose. No breakthrough seizures. Sleep is generally good.     HPI:   I had the pleasure of seeing your patient in neurology clinic for an outpatient consultation, who is a 78 y.o. year old male who was referred for evaluation of seizure and cognitive decline.  Please allow me to summarize the history for the record.    He is joined by wife who gives additional details. He was previously seeing Dr. Ardis. In early 2000s, he began having occasional episodes of behavioral arrest. Wife states he would stare and not respond for up to  2-3 minutes. He denies ever having a preceding aura aside from once when he experienced numbness around his head prior to onset. He was amnestic to these events. No automatisms that wife ever saw. They were occurring maybe once every 6 months. He reportedly had an abnormal EEG and Dr. Ardis started him on Keppra . He has been seizure free seince that time (2013).   He also thinks he has been having mild cognitive issues since around  that time too. They have become a little more noticeable. His wife reports that he has trouble with directions in familiar areas though reports his driving is still safe. He forgets recent conversations. He denies any AV hallucinations. No changes to gait.     ============================================================================================================================================  PMHx  Patient Active Problem List   Diagnosis    Arteriosclerotic cardiovascular disease (ASCVD)    Carotid artery stenosis    Hyperlipidemia    Epileptic seizure (CMS HCC)    Essential hypertension    Esophageal reflux    Anxiety    Dementia    Nephrolithiasis    Bilateral hearing loss, unspecified hearing loss type     Past Surgical History:   Procedure Laterality Date    HX CORONARY ARTERY BYPASS GRAFT           Family Medical History:       Problem Relation (Age of Onset)    Alzheimer's/Dementia Mother (17)    Cancer Other    Elevated Lipids Mother    Emphysema Father    Heart Attack Father    Migraines Other    Sleep apnea Other            Current Outpatient Medications   Medication Sig Dispense Refill    amLODIPine  (NORVASC) 10 mg Oral Tablet TAKE 1 TABLET ONCE DAILY   FOR HIGH BLOOD PRESSURE 90 Tablet 3    aspirin (ECOTRIN) 81 mg Oral Tablet, Delayed Release (E.C.) Take 1 Tablet (81 mg total) by mouth      cholecalciferol , vitamin D3, 250 mcg (10,000 unit) Oral Capsule Take 1 Capsule (10,000 Units total) by mouth Every 7 days      cyanocobalamin  (VITAMIN B-12) 1,000 mcg Oral Tablet Take 1 Tablet (1,000 mcg total) by mouth Once a day      donepeziL  (ARICEPT ) 10 mg Oral Tablet Take 1 Tablet (10 mg total) by mouth Every night for 90 days (Patient taking differently: Take 1 Tablet (10 mg total) by mouth 1/2 TABLET AT NIGHT) 90 Tablet 0    FLUoxetine  (PROZAC ) 40 mg Oral Capsule Take 1 Capsule (40 mg total) by mouth Daily 90 Capsule 3    Levetiracetam  500 mg Oral Tablet Sustained Release 24 hr Take 1 Tablet (500 mg  total) by mouth Twice daily 180 Tablet 0    omeprazole  (PRILOSEC) 20 mg Oral Capsule, Delayed Release(E.C.) TAKE 1 CAPSULE ONCE DAILY 90 Capsule 1    ondansetron  (ZOFRAN  ODT) 4 mg Oral Tablet, Rapid Dissolve Take 1 Tablet (4 mg total) by mouth Every 8 hours as needed for Nausea/Vomiting 12 Tablet 0    Perindopril  Erbumine (ACEON ) 4 mg Oral Tablet TAKE 1 TABLET DAILY 90 Tablet 3    rosuvastatin  (CRESTOR ) 5 mg Oral Tablet TAKE 1 TABLET ONCE DAILY 90 Tablet 3    tamsulosin  (FLOMAX ) 0.4 mg Oral Capsule Take 1 Capsule (0.4 mg total) by mouth Daily 90 Capsule 3     No current facility-administered medications for this visit.     No Known Allergies  Social History  Socioeconomic History    Marital status: Married     Spouse name: Not on file    Number of children: Not on file    Years of education: Not on file    Highest education level: Not on file   Occupational History    Not on file   Tobacco Use    Smoking status: Never    Smokeless tobacco: Never   Vaping Use    Vaping status: Never Used   Substance and Sexual Activity    Alcohol use: Never    Drug use: Never    Sexual activity: Not on file   Other Topics Concern    Not on file   Social History Narrative    Not on file     Social Determinants of Health     Financial Resource Strain: Not on file   Transportation Needs: Not on file   Social Connections: Not on file   Intimate Partner Violence: Not on file   Housing Stability: Not on file       ============================================================================================================================================  GENERAL EXAMINATION  BP 122/61 (Site: Left Arm, Patient Position: Sitting, Cuff Size: Adult)   Pulse 64   Temp 36.2 C (97.1 F) (Temporal)   Wt 82.1 kg (181 lb)   SpO2 96%   BMI 28.35 kg/m     Vital signs personally reviewed  General: No acute distress, alert  HEENT: Normocephalic, no scleral icterus  Extremities: No significant edema, No cyanosis    NEUROLOGIC EXAM  On  neurological exam, patient was awake, alert and answering questions appropriately  Speech was fluent, without dysarthria or aphasia.    CN  II-XII: grossly intact    MOTOR  Bulk: normal  Abnormal Movements: none    Strength:     MRC Grading Scale   Right Left   Deltoid 5 5   Biceps 5 5   Triceps 5 5   Wrist Extension - -   Wrist Flexion - -   Finger Extension - -   Finger Abduction - -   Finger Flexion - -   Hip Flexion 5 5   Hip Extension - -   Hip Abduction - -   Hip Adduction - -   Knee Extension 5 5   Knee Flexion 5 5   Ankle Dorsiflexion - -   Ankle Plantarflexion - -   Toe Extension - -   Toe Flexion - -     REFLEXES   Right Left   Biceps 2 2   Triceps 2 2   Brachioradialis 2 2   Patellar 2 2   Achilles - -   Plantar - -   Hoffman - -   Pectoralis - -   Jaw Jerk - -       SENSORY  Light touch: intact throughout    GAIT  General: casual, normal gait    COORDINATION  Finger nose finger: normal  ================================================================================================================================LABS  Personal Review of prior labs is notable for:   2024  TSH WNL   B12 244  CMP mildly elevated creatinine  CBC largely WNL   IMAGING  Personal Review of imaging is notable for:   MRI Brain October 2024  Largely unremarkable for age per read  OTHER DIAGNOSTICS  Personal Review of other prior diagnostics is notable for: not applicable

## 2024-04-25 NOTE — Assessment & Plan Note (Addendum)
 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 Followed by 20 minutes of postictal confusion. There is no clear aura and no motor automatisms during events. 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 He has been on Keppra  for a number of years now with no further episodes. He and wife believe there have only been 4-5 events total. He is currently prescribed 750mg  sustained release BID which is not clearly indicated in prior neurology notes. This would be equivalent of 1500mg  BID. I have talked with PCP who has been filling this and reports that she refilled what patient came to her on. I'm not sure he needs this dose. As he has had longstanding seizure freedom, we will decrease dose to 500 mg SR.     - continue Keppra  SR 500mg  BID (could consider once daily dosing if tolerating)    RTC 2 months

## 2024-04-30 ENCOUNTER — Other Ambulatory Visit (INDEPENDENT_AMBULATORY_CARE_PROVIDER_SITE_OTHER): Payer: Self-pay | Admitting: NEUROLOGY

## 2024-05-10 ENCOUNTER — Encounter (INDEPENDENT_AMBULATORY_CARE_PROVIDER_SITE_OTHER): Payer: Self-pay | Admitting: OTOLARYNGOLOGY

## 2024-05-10 ENCOUNTER — Ambulatory Visit: Payer: Self-pay | Admitting: OTOLARYNGOLOGY

## 2024-05-10 ENCOUNTER — Other Ambulatory Visit: Payer: Self-pay

## 2024-05-10 VITALS — Wt 181.0 lb

## 2024-05-10 DIAGNOSIS — H9313 Tinnitus, bilateral: Secondary | ICD-10-CM

## 2024-05-10 DIAGNOSIS — H6123 Impacted cerumen, bilateral: Secondary | ICD-10-CM

## 2024-05-10 DIAGNOSIS — K219 Gastro-esophageal reflux disease without esophagitis: Secondary | ICD-10-CM

## 2024-05-10 DIAGNOSIS — J301 Allergic rhinitis due to pollen: Secondary | ICD-10-CM | POA: Insufficient documentation

## 2024-05-10 DIAGNOSIS — H903 Sensorineural hearing loss, bilateral: Secondary | ICD-10-CM

## 2024-05-10 NOTE — Procedures (Signed)
 ENT, [facility]  [address]  [place] [address]  Operated by [facility]  Procedure Note    Name: Jason Jacobs MRN:  [id number]   Date: 05/10/2024 DOB:  [birth year] (78 y.o.)         [phone] - REMOVAL IMPACTED CERUMEN W/ INSTRUMENT, UNILATERAL (AMB ONLY-PD)    Performed by: Irving Anes, DO  Authorized by: Irving Anes, DO    Time Out:     Immediately before the procedure, a time out was called:  Yes    Patient verified:  Yes    Procedure Verified:  Yes    Site Verified:  Yes  Procedure: Cerumen cleaning  Pre-op Dx: Cerumen impaction      Bilateral EAC(s) examined under binocular microscopy.  Cerumen and/or debris was cleaned from the canal(s) using curettes, suction, and alligator forceps.  Patient tolerated procedure well.  ENT was present for the entire procedure.     Anes Irving, DO

## 2024-05-10 NOTE — Procedures (Signed)
 [facility]  [address]  [place] [address]  Operated by [facility]  Procedure Note    Name: Sukhdeep Wieting MRN:  [id number]   Date: 05/10/2024 DOB:  [birth year] (78 y.o.)         31575 - LARYNGOSCOPY, FLEXIBLE DIAGNOSTIC (AMB ONLY)    Performed by: Irving Anes, DO  Authorized by: Irving Anes, DO    Time Out:     Immediately before the procedure, a time out was called:  Yes    Patient verified:  Yes    Procedure Verified:  Yes    Site Verified:  Yes    Indications for procedure: Unable to fully visualize laryngeal anatomy on indirect laryngoscopy and GERD / LPR management    Anesthesia:  Topical lidocaine/ phenylephrine     Description: The flexible endoscope was gently introduced into the nostril and passed along the floor of the nose to the nasopharynx. Adenoid pad and Eustachian tube orifices. The retropalatal airway was patent.    The endoscope was passed to the oropharynx.  Base of tongue/lingual tonsils without masses or lesions, patent valelulla, and sharply defined upright epiglottis. Retrolingual airway was patent.    The larynx displayed normal true vocal cords with good mobility. False cords were normal. Arytenoid mucosa was mildly erythematous and edematous.    The piriform recesses were symmetric without secretion. The hypopharynx was symmetric without lesion.    Findings: Laryngopharyngeal Reflux    The patient tolerated the procedure well    This note may have been partially generated using MModal Fluency Direct system, and there may be some incorrect words, spellings, and punctuation that were not noted in checking the note before saving, though effort was made to avoid such errors.    Anes Irving, DO

## 2024-05-10 NOTE — H&P (Signed)
 [facility]  [address]  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 male who is presents for follow-up on LPR/ SNHL/ AR. c/o aural fullness and clogging.     Patient History:  Patient Active Problem List   Diagnosis    Arteriosclerotic cardiovascular disease (ASCVD)    Carotid artery stenosis    Hyperlipidemia    Epileptic seizure (CMS HCC)    Essential hypertension    Esophageal reflux    Anxiety    Dementia    Nephrolithiasis    Bilateral hearing loss, unspecified hearing loss type     Current Outpatient Medications   Medication Sig    amLODIPine  (NORVASC) 10 mg Oral Tablet TAKE 1 TABLET ONCE DAILY   FOR HIGH BLOOD PRESSURE    aspirin (ECOTRIN) 81 mg Oral Tablet, Delayed Release (E.C.) Take 1 Tablet (81 mg total) by mouth    cholecalciferol , vitamin D3, 250 mcg (10,000 unit) Oral Capsule Take 1 Capsule (10,000 Units total) by mouth Every 7 days    cyanocobalamin  (VITAMIN B-12) 1,000 mcg Oral Tablet Take 1 Tablet (1,000 mcg total) by mouth Once a day    FLUoxetine  (PROZAC ) 40 mg Oral Capsule Take 1 Capsule (40 mg total) by mouth Daily    Levetiracetam  500 mg Oral Tablet Sustained Release 24 hr Take 1 Tablet (500 mg total) by mouth Twice daily    omeprazole  (PRILOSEC) 20 mg Oral Capsule, Delayed Release(E.C.) TAKE 1 CAPSULE ONCE DAILY    ondansetron  (ZOFRAN  ODT) 4 mg Oral Tablet, Rapid Dissolve Take 1 Tablet (4 mg total) by mouth Every 8 hours as needed for Nausea/Vomiting    Perindopril  Erbumine (ACEON ) 4 mg Oral Tablet TAKE 1 TABLET DAILY    rosuvastatin  (CRESTOR ) 5 mg Oral Tablet TAKE 1 TABLET ONCE DAILY    tamsulosin   (FLOMAX ) 0.4 mg Oral Capsule Take 1 Capsule (0.4 mg total) by mouth Daily      No Known Allergies  Past Medical History:   Diagnosis Date    Anxiety     Carotid stenosis, left 09/27/2015    Chronic obstructive airway disease     Coronary artery disease     Dementia     Elevated PSA     Epileptic seizure (CMS HCC)     Esophageal reflux     Essential hypertension     Hearing loss     Hyperlipidemia     Meniere's disease     PAD (peripheral artery disease) (CMS HCC)      Past Surgical History:   Procedure Laterality Date    HX CORONARY ARTERY BYPASS GRAFT       Family Medical History:       Problem Relation (Age of Onset)    Alzheimer's/Dementia Mother (61)    Cancer Other    Elevated Lipids Mother    Emphysema Father    Heart Attack Father    Migraines Other    Sleep apnea Other  Social History     Tobacco Use    Smoking status: Never    Smokeless tobacco: Never   Vaping Use    Vaping status: Never Used   Substance Use Topics    Alcohol use: Never    Drug use: Never       Review of Systems:  Review of Systems    Physical Exam:  Wt 82.1 kg (181 lb)   BMI 28.35 kg/m       Physical Exam  Constitutional:       Appearance: Normal appearance. He is well-developed, well-groomed and normal weight.   HENT:      Head: Normocephalic and atraumatic.      Right Ear: Hearing, tympanic membrane, ear canal and external ear normal. There is impacted cerumen.      Left Ear: Hearing, tympanic membrane, ear canal and external ear normal. There is impacted cerumen.      Nose: Septal deviation and mucosal edema present.      Right Turbinates: Enlarged.      Left Turbinates: Enlarged.      Mouth/Throat:      Lips: Pink.      Mouth: Mucous membranes are moist.      Pharynx: Oropharynx is clear. Uvula midline.   Eyes:      Extraocular Movements: Extraocular movements intact.   Neck:      Trachea: Phonation normal.   Pulmonary:      Effort: Pulmonary effort is normal.   Musculoskeletal:      Cervical back: Normal range of motion and  neck supple.   Lymphadenopathy:      Cervical: No cervical adenopathy.   Skin:     General: Skin is warm.   Neurological:      Mental Status: He is alert and oriented to person, place, and time.      Cranial Nerves: Cranial nerves 2-12 are intact. No facial asymmetry.   Psychiatric:         Attention and Perception: Attention normal.         Mood and Affect: Mood normal.         Speech: Speech normal.         Behavior: Behavior normal. Behavior is cooperative.          Assessment:  ENCOUNTER DIAGNOSES     ICD-10-CM   1. 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 Bilateral impacted cerumen  H61.23   3. 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 Laryngopharyngeal reflux (LPR)  K21.9         Plan:  Medical records reviewed on 05/10/2024.  AU cerumen debrided.   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  Reflux precautions/diet modifications  Continue Prilosec daily for chronic LPR  Orders Placed This Encounter    30789 - REMOVAL IMPACTED CERUMEN W/ INSTRUMENT, UNILATERAL (AMB ONLY-PD)    31575 - LARYNGOSCOPY, FLEXIBLE DIAGNOSTIC (AMB ONLY)     Follow-up in 8-12 months or sooner PRN      Oneil Alvine, D.O., MMS  ENT / Facial Plastic Surgery      I appreciate the opportunity to be involved in the care of your patients.  If you have any questions or concerns regarding this encounter, please do not hesitate to contact me at your convenience.        This note may have been partially generated using MModal Fluency Direct system, and there may be some  incorrect words, spellings, and punctuation that were not noted in checking the note before saving, though effort was made to avoid such errors.

## 2024-05-17 ENCOUNTER — Ambulatory Visit (INDEPENDENT_AMBULATORY_CARE_PROVIDER_SITE_OTHER): Payer: Self-pay | Admitting: OTOLARYNGOLOGY

## 2024-05-24 ENCOUNTER — Other Ambulatory Visit (INDEPENDENT_AMBULATORY_CARE_PROVIDER_SITE_OTHER): Payer: Self-pay | Admitting: Internal Medicine

## 2024-05-24 DIAGNOSIS — K219 Gastro-esophageal reflux disease without esophagitis: Secondary | ICD-10-CM

## 2024-06-28 ENCOUNTER — Other Ambulatory Visit: Payer: Self-pay

## 2024-06-28 ENCOUNTER — Ambulatory Visit (INDEPENDENT_AMBULATORY_CARE_PROVIDER_SITE_OTHER): Payer: Self-pay

## 2024-06-28 ENCOUNTER — Encounter (INDEPENDENT_AMBULATORY_CARE_PROVIDER_SITE_OTHER): Payer: Self-pay

## 2024-06-28 VITALS — BP 128/62 | HR 71 | Temp 96.9°F | Wt 180.0 lb

## 2024-06-28 DIAGNOSIS — R4189 Other symptoms and signs involving cognitive functions and awareness: Secondary | ICD-10-CM

## 2024-06-28 DIAGNOSIS — G40909 Epilepsy, unspecified, not intractable, without status epilepticus: Secondary | ICD-10-CM

## 2024-06-28 NOTE — Assessment & Plan Note (Addendum)
 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 Followed by 20 minutes of postictal confusion. There is no clear aura and no motor automatisms during events. 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 He has been on Keppra  for a number of years now with no further episodes. He and wife believe there have only been 4-5 events total. He was previously prescribed 750mg  sustained release BID which was not clearly indicated in prior neurology notes. This would be equivalent of 1500mg  BID. This was previously discussed with PCP who had been filling this and reported that she refilled what patient came to her on. I'm not sure he needs this dose. As he has had longstanding seizure freedom, his dose was decreased to 500 mg SR which he is tolerating well and has had not breakthrough events. We discussed possibly considering once daily dosing. At this time, his wife is uncomfortable changing the dose and would like to defer to the next visit.      - Continue Keppra  SR 500mg  BID (could consider once daily dosing if tolerating)  - Instructed to call with any breakthrough events  - Return to clinic in 3 months for follow up

## 2024-09-26 ENCOUNTER — Encounter (INDEPENDENT_AMBULATORY_CARE_PROVIDER_SITE_OTHER): Payer: Self-pay

## 2024-10-10 ENCOUNTER — Ambulatory Visit (INDEPENDENT_AMBULATORY_CARE_PROVIDER_SITE_OTHER): Payer: Self-pay | Admitting: Internal Medicine

## 2025-01-10 ENCOUNTER — Encounter (INDEPENDENT_AMBULATORY_CARE_PROVIDER_SITE_OTHER): Payer: Self-pay | Admitting: Physician Assistant

## 2025-05-10 ENCOUNTER — Ambulatory Visit (INDEPENDENT_AMBULATORY_CARE_PROVIDER_SITE_OTHER): Payer: Self-pay | Admitting: OTOLARYNGOLOGY
